# Patient Record
Sex: Male | Born: 1945 | Race: White | Hispanic: No | Marital: Married | Smoking: Former smoker
Health system: Southern US, Community
[De-identification: ages and names within clinical notes are randomized; demographics above are authoritative.]

## PROBLEM LIST (undated history)

## (undated) DIAGNOSIS — I219 Acute myocardial infarction, unspecified: Secondary | ICD-10-CM

## (undated) DIAGNOSIS — R062 Wheezing: Secondary | ICD-10-CM

## (undated) DIAGNOSIS — R634 Abnormal weight loss: Secondary | ICD-10-CM

## (undated) DIAGNOSIS — K219 Gastro-esophageal reflux disease without esophagitis: Secondary | ICD-10-CM

## (undated) DIAGNOSIS — H3500 Unspecified background retinopathy: Secondary | ICD-10-CM

## (undated) DIAGNOSIS — R059 Cough, unspecified: Secondary | ICD-10-CM

## (undated) DIAGNOSIS — I4891 Unspecified atrial fibrillation: Secondary | ICD-10-CM

## (undated) DIAGNOSIS — I2510 Atherosclerotic heart disease of native coronary artery without angina pectoris: Secondary | ICD-10-CM

## (undated) DIAGNOSIS — E785 Hyperlipidemia, unspecified: Secondary | ICD-10-CM

## (undated) DIAGNOSIS — M1990 Unspecified osteoarthritis, unspecified site: Secondary | ICD-10-CM

## (undated) DIAGNOSIS — R499 Unspecified voice and resonance disorder: Secondary | ICD-10-CM

## (undated) DIAGNOSIS — R233 Spontaneous ecchymoses: Secondary | ICD-10-CM

## (undated) DIAGNOSIS — R519 Headache, unspecified: Secondary | ICD-10-CM

## (undated) DIAGNOSIS — I10 Essential (primary) hypertension: Secondary | ICD-10-CM

## (undated) DIAGNOSIS — IMO0001 Reserved for inherently not codable concepts without codable children: Secondary | ICD-10-CM

## (undated) DIAGNOSIS — R05 Cough: Secondary | ICD-10-CM

## (undated) DIAGNOSIS — M7989 Other specified soft tissue disorders: Secondary | ICD-10-CM

## (undated) DIAGNOSIS — D369 Benign neoplasm, unspecified site: Secondary | ICD-10-CM

## (undated) DIAGNOSIS — I739 Peripheral vascular disease, unspecified: Secondary | ICD-10-CM

## (undated) DIAGNOSIS — R531 Weakness: Secondary | ICD-10-CM

## (undated) DIAGNOSIS — B351 Tinea unguium: Secondary | ICD-10-CM

## (undated) DIAGNOSIS — R197 Diarrhea, unspecified: Secondary | ICD-10-CM

## (undated) DIAGNOSIS — E0590 Thyrotoxicosis, unspecified without thyrotoxic crisis or storm: Secondary | ICD-10-CM

## (undated) DIAGNOSIS — I509 Heart failure, unspecified: Secondary | ICD-10-CM

## (undated) DIAGNOSIS — R21 Rash and other nonspecific skin eruption: Secondary | ICD-10-CM

## (undated) DIAGNOSIS — I724 Aneurysm of artery of lower extremity: Secondary | ICD-10-CM

## (undated) DIAGNOSIS — M79606 Pain in leg, unspecified: Secondary | ICD-10-CM

## (undated) DIAGNOSIS — R1310 Dysphagia, unspecified: Secondary | ICD-10-CM

## (undated) DIAGNOSIS — I255 Ischemic cardiomyopathy: Secondary | ICD-10-CM

## (undated) DIAGNOSIS — R51 Headache: Secondary | ICD-10-CM

## (undated) DIAGNOSIS — R238 Other skin changes: Secondary | ICD-10-CM

## (undated) DIAGNOSIS — Z951 Presence of aortocoronary bypass graft: Secondary | ICD-10-CM

## (undated) HISTORY — DX: Cough, unspecified: R05.9

## (undated) HISTORY — DX: Pain in leg, unspecified: M79.606

## (undated) HISTORY — DX: Unspecified atrial fibrillation: I48.91

## (undated) HISTORY — DX: Spontaneous ecchymoses: R23.3

## (undated) HISTORY — DX: Thyrotoxicosis, unspecified without thyrotoxic crisis or storm: E05.90

## (undated) HISTORY — DX: Peripheral vascular disease, unspecified: I73.9

## (undated) HISTORY — DX: Atherosclerotic heart disease of native coronary artery without angina pectoris: I25.10

## (undated) HISTORY — DX: Wheezing: R06.2

## (undated) HISTORY — DX: Reserved for inherently not codable concepts without codable children: IMO0001

## (undated) HISTORY — DX: Ischemic cardiomyopathy: I25.5

## (undated) HISTORY — DX: Diarrhea, unspecified: R19.7

## (undated) HISTORY — DX: Presence of aortocoronary bypass graft: Z95.1

## (undated) HISTORY — PX: CATARACT EXTRACTION, BILATERAL: SHX1313

## (undated) HISTORY — DX: Abnormal weight loss: R63.4

## (undated) HISTORY — DX: Aneurysm of artery of lower extremity: I72.4

## (undated) HISTORY — DX: Hyperlipidemia, unspecified: E78.5

## (undated) HISTORY — DX: Unspecified background retinopathy: H35.00

## (undated) HISTORY — DX: Other skin changes: R23.8

## (undated) HISTORY — DX: Headache: R51

## (undated) HISTORY — DX: Unspecified voice and resonance disorder: R49.9

## (undated) HISTORY — PX: CORONARY ARTERY BYPASS GRAFT: SHX141

## (undated) HISTORY — DX: Weakness: R53.1

## (undated) HISTORY — DX: Unspecified osteoarthritis, unspecified site: M19.90

## (undated) HISTORY — DX: Benign neoplasm, unspecified site: D36.9

## (undated) HISTORY — DX: Acute myocardial infarction, unspecified: I21.9

## (undated) HISTORY — DX: Essential (primary) hypertension: I10

## (undated) HISTORY — DX: Headache, unspecified: R51.9

## (undated) HISTORY — DX: Dysphagia, unspecified: R13.10

## (undated) HISTORY — DX: Rash and other nonspecific skin eruption: R21

## (undated) HISTORY — DX: Tinea unguium: B35.1

## (undated) HISTORY — DX: Other specified soft tissue disorders: M79.89

## (undated) HISTORY — DX: Heart failure, unspecified: I50.9

## (undated) HISTORY — DX: Gastro-esophageal reflux disease without esophagitis: K21.9

## (undated) HISTORY — DX: Cough: R05

---

## 1991-09-03 DIAGNOSIS — Z951 Presence of aortocoronary bypass graft: Secondary | ICD-10-CM

## 1991-09-03 HISTORY — DX: Presence of aortocoronary bypass graft: Z95.1

## 1992-09-02 HISTORY — PX: BACK SURGERY: SHX140

## 2000-02-18 ENCOUNTER — Ambulatory Visit (HOSPITAL_COMMUNITY): Admission: RE | Admit: 2000-02-18 | Discharge: 2000-02-18 | Payer: Self-pay | Admitting: Interventional Cardiology

## 2000-03-17 ENCOUNTER — Encounter: Payer: Self-pay | Admitting: Surgery

## 2000-03-19 ENCOUNTER — Inpatient Hospital Stay (HOSPITAL_COMMUNITY): Admission: RE | Admit: 2000-03-19 | Discharge: 2000-03-25 | Payer: Self-pay | Admitting: Surgery

## 2000-03-19 ENCOUNTER — Encounter: Payer: Self-pay | Admitting: Surgery

## 2000-03-20 ENCOUNTER — Encounter: Payer: Self-pay | Admitting: Surgery

## 2005-04-11 ENCOUNTER — Inpatient Hospital Stay (HOSPITAL_BASED_OUTPATIENT_CLINIC_OR_DEPARTMENT_OTHER): Admission: RE | Admit: 2005-04-11 | Discharge: 2005-04-11 | Payer: Self-pay | Admitting: Interventional Cardiology

## 2005-04-23 ENCOUNTER — Ambulatory Visit (HOSPITAL_COMMUNITY): Admission: RE | Admit: 2005-04-23 | Discharge: 2005-04-24 | Payer: Self-pay | Admitting: Interventional Cardiology

## 2005-12-24 ENCOUNTER — Emergency Department (HOSPITAL_COMMUNITY): Admission: EM | Admit: 2005-12-24 | Discharge: 2005-12-24 | Payer: Self-pay | Admitting: Emergency Medicine

## 2006-05-07 ENCOUNTER — Encounter: Admission: RE | Admit: 2006-05-07 | Discharge: 2006-05-07 | Payer: Self-pay | Admitting: Interventional Cardiology

## 2006-06-09 ENCOUNTER — Ambulatory Visit (HOSPITAL_COMMUNITY): Admission: RE | Admit: 2006-06-09 | Discharge: 2006-06-09 | Payer: Self-pay | Admitting: Interventional Cardiology

## 2006-09-02 HISTORY — PX: PR VEIN BYPASS GRAFT,AORTO-FEM-POP: 35551

## 2006-09-09 ENCOUNTER — Inpatient Hospital Stay (HOSPITAL_COMMUNITY): Admission: RE | Admit: 2006-09-09 | Discharge: 2006-09-12 | Payer: Self-pay | Admitting: *Deleted

## 2006-09-10 ENCOUNTER — Encounter: Payer: Self-pay | Admitting: Vascular Surgery

## 2006-11-10 ENCOUNTER — Ambulatory Visit (HOSPITAL_COMMUNITY): Admission: RE | Admit: 2006-11-10 | Discharge: 2006-11-10 | Payer: Self-pay | Admitting: Interventional Cardiology

## 2007-04-01 ENCOUNTER — Ambulatory Visit: Payer: Self-pay | Admitting: Vascular Surgery

## 2007-07-02 ENCOUNTER — Ambulatory Visit: Payer: Self-pay | Admitting: *Deleted

## 2007-10-05 ENCOUNTER — Ambulatory Visit: Payer: Self-pay | Admitting: *Deleted

## 2008-03-24 ENCOUNTER — Ambulatory Visit: Payer: Self-pay | Admitting: *Deleted

## 2008-12-08 ENCOUNTER — Observation Stay (HOSPITAL_COMMUNITY): Admission: AD | Admit: 2008-12-08 | Discharge: 2008-12-09 | Payer: Self-pay | Admitting: Interventional Cardiology

## 2008-12-08 ENCOUNTER — Inpatient Hospital Stay (HOSPITAL_BASED_OUTPATIENT_CLINIC_OR_DEPARTMENT_OTHER): Admission: RE | Admit: 2008-12-08 | Discharge: 2008-12-08 | Payer: Self-pay | Admitting: Interventional Cardiology

## 2009-03-17 ENCOUNTER — Ambulatory Visit: Payer: Self-pay | Admitting: *Deleted

## 2009-03-17 DIAGNOSIS — M79606 Pain in leg, unspecified: Secondary | ICD-10-CM

## 2009-03-17 HISTORY — DX: Pain in leg, unspecified: M79.606

## 2010-12-12 LAB — CBC
HCT: 40.9 % (ref 39.0–52.0)
Platelets: 226 10*3/uL (ref 150–400)
WBC: 5.1 10*3/uL (ref 4.0–10.5)

## 2010-12-12 LAB — PROTIME-INR
INR: 1.1 (ref 0.00–1.49)
INR: 1.2 (ref 0.00–1.49)
Prothrombin Time: 14.2 seconds (ref 11.6–15.2)

## 2010-12-12 LAB — BASIC METABOLIC PANEL
BUN: 10 mg/dL (ref 6–23)
Chloride: 101 mEq/L (ref 96–112)
Glucose, Bld: 125 mg/dL — ABNORMAL HIGH (ref 70–99)
Potassium: 3.8 mEq/L (ref 3.5–5.1)
Sodium: 137 mEq/L (ref 135–145)

## 2010-12-12 LAB — POCT I-STAT GLUCOSE: Operator id: 141321

## 2010-12-12 LAB — BRAIN NATRIURETIC PEPTIDE: Pro B Natriuretic peptide (BNP): 302 pg/mL — ABNORMAL HIGH (ref 0.0–100.0)

## 2010-12-12 LAB — GLUCOSE, CAPILLARY
Glucose-Capillary: 123 mg/dL — ABNORMAL HIGH (ref 70–99)
Glucose-Capillary: 133 mg/dL — ABNORMAL HIGH (ref 70–99)
Glucose-Capillary: 157 mg/dL — ABNORMAL HIGH (ref 70–99)

## 2010-12-12 LAB — CARDIAC PANEL(CRET KIN+CKTOT+MB+TROPI)
CK, MB: 1 ng/mL (ref 0.3–4.0)
Total CK: 74 U/L (ref 7–232)
Troponin I: <0.01 ng/mL (ref 0.00–0.06)

## 2011-01-15 NOTE — Procedures (Signed)
BYPASS GRAFT EVALUATION   INDICATION:  Follow-up left fem-pop bypass graft.   HISTORY:  Diabetes:  Yes.  Cardiac:  CABG in 1993 and 2001.  Hypertension:  Yes.  Smoking:  No.  Previous Surgery:  Please see above.   SINGLE LEVEL ARTERIAL EXAM                               RIGHT              LEFT  Brachial:                    139                145  Anterior tibial:             144                141  Posterior tibial:            126                147  Peroneal:  Ankle/brachial index:        0.98               1.00   PREVIOUS ABI:  Date: 10/05/2007  RIGHT:  >1  LEFT:  >1   LOWER EXTREMITY BYPASS GRAFT DUPLEX EXAM:   DUPLEX:  Patent left fem-pop bypass graft, no evidence of focal  stenosis.   IMPRESSION:  1. Patent left fem-pop bypass graft with no evidence of focal      stenosis.  2. Normal ankle branchial index with biphasic Doppler waveform noted      in bilateral legs.  3. Status post left fem-pop bypass.   ___________________________________________  P. Liliane Bade, M.D.   MG/MEDQ  D:  03/24/2008  T:  03/24/2008  Job:  [id number]

## 2011-01-15 NOTE — Procedures (Signed)
BYPASS GRAFT EVALUATION   INDICATION:  Follow up left leg bypass graft.   HISTORY:  Diabetes:  Yes.  Cardiac:  Coronary artery disease, CABG in May 1993 followed by a re-do  CABG in July 2001.  The patient is also status post an MI in 23.  Hypertension:  Yes.  Smoking:  Quit in 1985.  Previous Surgery:  Left superficial femoral to popliteal artery bypass  graft with reverse saphenous vein on September 09, 2006, by Dr. Madilyn Fireman.   SINGLE LEVEL ARTERIAL EXAM                               RIGHT              LEFT  Brachial:                    140                140  Anterior tibial:             110                110  Posterior tibial:            124                136  Peroneal:  Ankle/brachial index:        0.89               0.97   PREVIOUS ABI:  Date: 09/10/2006  RIGHT:  0.77  LEFT:  0.88   LOWER EXTREMITY BYPASS GRAFT DUPLEX EXAM:   DUPLEX:  Doppler arterial waveforms are biphasic proximal to, throughout  and distal to the graft.  Velocities are within normal limits  throughout.   IMPRESSION:  Patent left superficial femoral to popliteal artery bypass  graft.  ABIs are improved bilaterally from previous study.   ___________________________________________  P. Liliane Bade, M.D.   DP/MEDQ  D:  04/01/2007  T:  04/02/2007  Job:  [id number]

## 2011-01-15 NOTE — Procedures (Signed)
BYPASS GRAFT EVALUATION   INDICATION:  Followup, left leg bypass graft.   HISTORY:  Diabetes:  Yes.  Cardiac:  Coronary artery disease, CABG in 1993 and redo CABG in 2001,  MI in 1995.  Hypertension:  Yes.  Smoking:  No.  Previous Surgery:  Left superficial femoral-popliteal artery bypass  graft with reversed saphenous vein in 01/08 by Dr. Madilyn Fireman.   SINGLE LEVEL ARTERIAL EXAM                               RIGHT              LEFT  Brachial:                    124                120  Anterior tibial:             124                110  Posterior tibial:            126                136  Peroneal:  Ankle/brachial index:        >1.0               >1.0   PREVIOUS ABI:  Date: 07/02/07  RIGHT:  0.94  LEFT:  1.0   LOWER EXTREMITY BYPASS GRAFT DUPLEX EXAM:   DUPLEX:  Doppler arterial waveforms are biphasic proximal to,  throughout, and distal to the graft.  Velocities are within normal  limits.   IMPRESSION:  1. Patent left femoral-popliteal artery bypass graft.  2. Ankle brachial indices are within normal limits bilaterally.    ___________________________________________  P. Liliane Bade, M.D.   DP/MEDQ  D:  10/05/2007  T:  10/05/2007  Job:  [id number]

## 2011-01-15 NOTE — Discharge Summary (Signed)
[name], [name] NO.:  [id number]   MEDICAL RECORD NO.:  [id number]          PATIENT TYPE:  INP   LOCATION:  2035                         FACILITY:  [facility]   PHYSICIAN:  Lyn Records, M.D.   DATE OF BIRTH:  June 15, 1946   DATE OF ADMISSION:  12/08/2008  DATE OF DISCHARGE:  12/09/2008                               DISCHARGE SUMMARY   [name] is a 65 year old male patient of Dr. Katrinka Blazing who has known  coronary artery disease.  He had coronary artery bypass grafting in  1993, with the following grafts:  LIMA to the LAD, sequential saphenous  vein graft to the OM1 and OM2, saphenous vein graft to the diagonal,  saphenous vein graft to the PDA.  He had redo CABG in 2002, with a free  RIMA to the LAD and saphenous vein graft to the PDA.  He has had a drug-  eluting stent to the saphenous vein graft of the distal circumflex in  2006.  He did have an ischemic cardiomyopathy, but by 2006, his LVEF had  improved to 55%.   Apparently, he had been having some chest pain and this was similar to  his prior anginal episodes.  He was brought into the hospital for  diagnostic cardiac catheterization on December 08, 2008.  Incidentally, upon  arrival, he was found to be in atrial fibrillation with RVR.   We went ahead and did the cardiac catheterization and this showed that  the seventh vein graft to the RCA/PDA was occluded, but all other grafts  were patent.  His EF was 40%.  He tolerated the procedure well and then  we started treating him for his atrial fibrillation.   He was started on amiodarone and by discharge he was back in normal  sinus rhythm.  We also started Coumadin and will be loading  him as an  outpatient.   LABORATORY STUDIES DURING HOSPITALIZATION:  At the time of discharge,  his protime was 15.5 with an INR of 1.2.  Cardiac enzymes were negative.  Sodium 137, potassium 3.8, BUN 10, creatinine 0.83, and BNP 302.  Hemoglobin 14.0, hematocrit 40.9, platelets  226, and white count 5.1.  Chest x-ray showed cardiomegaly.  No evidence for pulmonary edema.   DISCHARGE MEDICATIONS:  1. Amiodarone 200 mg 2 tablets daily.  2. Coumadin 5 mg a day.  3. He is to restart his metformin on Sunday, December 11, 2008.  4. New dose of Coreg 25 mg one-half tablet twice a day.  5. Potassium 20 mEq a day.  6. Lipitor 40 mg a day.  7. Lasix 80 mg a day.  8. Isosorbide dinitrate 40 mg daily.  9. Plavix 75 mg a day.  10.Micardis 80 mg a day.  11.Nifedipine 90 mg a day.  12.Glucotrol 10 mg daily.  13.He is to stop taking aspirin.   DISCHARGE INSTRUCTIONS:  Remained on low-sodium heart-healthy diet.  Clean cath site gently with soap and water.  Increase activity slowly.  No lifting over 10 pounds for 1 week.  No driving for 2 days.   FOLLOW UP:  1.  Follow up in the [facility] on December 13, 2008, at 3:30 p.m.  2. Follow up with Dr. Katrinka Blazing with an EKG on December 29, 2008, at 3:30      p.m.   DISPOSITION:  He is discharged to home in stable but improved condition.      Guy Franco, P.A.      Lyn Records, M.D.  Electronically Signed    LB/MEDQ  D:  12/09/2008  T:  12/10/2008  Job:  [id number]   cc:   Theressa Millard, M.D.

## 2011-01-15 NOTE — Procedures (Signed)
BYPASS GRAFT EVALUATION   INDICATION:  Follow-up evaluation of lower extremity bypass graft.   HISTORY:  Diabetes:  Yes.  Cardiac:  CABG in 1993 and in 2001.  Hypertension:  Yes.  Smoking:  No.  Previous Surgery:  Left superficial femoral to popliteal artery bypass  graft with reverse saphenous vein on 01/08 by Dr. Madilyn Fireman.   SINGLE LEVEL ARTERIAL EXAM                               RIGHT              LEFT  Brachial:                    153                157  Anterior tibial:             133                156  Posterior tibial:            125                162  Peroneal:  Ankle/brachial index:        0.85               1.03   PREVIOUS ABI:  Date: 03/24/08  RIGHT:  0.98  LEFT:  1.00   LOWER EXTREMITY BYPASS GRAFT DUPLEX EXAM:   DUPLEX:  Biphasic duplex waveforms noted within the left femoral-to-  popliteal artery bypass graft and native artery.   IMPRESSION:  1. Patent left femoropopliteal bypass graft with no evidence of focal      stenosis.  2. Right lower extremity ankle brachial indices suggest mild arterial      disease with biphasic Doppler waveforms.  3. Normal left lower extremity ankle brachial index with biphasic      Doppler waveforms.        ___________________________________________  P. Liliane Bade, M.D.   AC/MEDQ  D:  03/20/2009  T:  03/20/2009  Job:  [id number]

## 2011-01-15 NOTE — Cardiovascular Report (Signed)
[name], [name]             ACCOUNT NO.:  [id number]   MEDICAL RECORD NO.:  [id number]          PATIENT TYPE:  OIB   LOCATION:  1962                         FACILITY:  [facility]   PHYSICIAN:  Lyn Records, M.D.   DATE OF BIRTH:  1945/10/01   DATE OF PROCEDURE:  DATE OF DISCHARGE:  12/08/2008                            CARDIAC CATHETERIZATION   INDICATIONS:  Mr. Knauer has progressive episodes of chest tightness  and dyspnea.  He has a history two prior coronary artery bypass  operations, most recently in 2002.  He has a free RIMA graft to the LAD  and saphenous vein graft to the PDA with a history of sequential  saphenous vein graft to the distal circumflex with DES stenting LIMA to  the LAD and a known saphenous vein graft to the diagonal.   PROCEDURE PERFORMED:  1. Left heart catheterization.  2. Left ventriculography.  3. Coronary angiography.  4. Saphenous vein graft angiography.  5. Internal mammary artery graft angiography.   DESCRIPTION:  After informed consent, 1% Xylocaine was used to achieve  anesthesia.  A 4-French sheath was placed in the right femoral artery  using a modified Seldinger technique.  Some difficulty with palpating  the femoral artery was noted.  A 4-French A2 multipurpose catheter was  then used for hemodynamic recordings, left ventriculography by hand  injection, and saphenous vein graft angiography.  The internal mammary  artery catheter was used for left internal mammary graft angiography.  The native right coronary was cannulated with the multipurpose catheter.  A #4 4-French left Judkins catheter was used for left coronary  angiography.  No complications occurred.   Of note during this procedure, the patient's heart rate remained above  100 and varied between 100 and 125, and analysis of the rhythm strips  demonstrated atrial fibrillation.  This is a new rhythm disturbance.  The most recent EKG on November 16, 2008, demonstrated normal sinus  rhythm  with PAC.   RESULTS:  1. Hemodynamic data:      a.     Aortic pressure 120/70.      b.     Left ventricular pressure 126/11.  2. Left ventriculography:  Left ventricle is dilated.  There is      anterior wall and inferior wall hypokinesis.  EF 40%, plus or minus      10 percentage points.  LV is poorly opacified by hand injection.  3. Coronary angiography.      a.     Left main coronary artery:  Highly diseased with greater       than 90% stenosis.      b.     Left anterior descending coronary artery:  Occluded.      c.     Circumflex artery:  Totally occluded.      d.     Right coronary artery:  Totally occluded.  4. Bypass graft angiography.      a.     Saphenous vein graft to the diagonal:  Patent with moderate       diffuse disease in the diagonal distal to the  graft insertion       site.      b.     Saphenous sequential graft to the obtuse marginal/circumflex       territory:  This graft is widely patent.  The Cypher stent at the       distal graft before the origin of the PDA is widely patent.  The       obtuse marginal branches that arise distal to the anastomoses are       widely patent.      c.     Saphenous vein graft to the right coronary artery:  Totally       occluded.      d.     RIMA to the distal LAD:  Widely patent.      e.     Second saphenous vein graft to the PDA:  Totally occluded.  5. LIMA to the LAD:  Widely patent.   CONCLUSIONS:  1. There is severe native vessel coronary disease with total occlusion      of the LAD, circumflex, and right coronary.  2. Bypass graft occlusive disease with total occlusion of both      saphenous vein grafts to the right coronary.  The distal RCA is      supplied by collaterals from the LAD territory via __________.  3. Patent saphenous vein graft sequential to the obtuse marginal      system, and saphenous vein graft to the diagonal.  4. Patent free RIMA to the distal LAD.  5. Patent LIMA to the LAD.  6. Atrial  fibrillation with rapid ventricular response.   PLAN:  Continue medical therapy for coronary artery disease.  Pharmacologic control of the patient's atrial fibrillation to include  the possibility of rhythm control versus rate control.  We will start  amiodarone, titrate dose upwards.  We will start Coumadin.  We will plan  on electrocardioversion if he does not convert spontaneously.      Lyn Records, M.D.  Electronically Signed     HWS/MEDQ  D:  12/08/2008  T:  12/09/2008  Job:  [id number]

## 2011-01-15 NOTE — Procedures (Signed)
BYPASS GRAFT EVALUATION   INDICATION:  Follow up of left SFA-popliteal bypass graft.  Patient  denies claudication or rest pain.  Left leg with swelling.   HISTORY:  Diabetes:  Yes.  Cardiac:  Coronary artery disease.  CABG in 01/1992 followed by a redo  CABG in 03/2000.  MI in 1995.  Hypertension:  Yes.  Smoking:  No.  Previous Surgery:  Left SFA-popliteal bypass graft with reverse  saphenous vein on 09/10/1999 by Dr. Madilyn Fireman.   SINGLE LEVEL ARTERIAL EXAM                               RIGHT              LEFT  Brachial:                    130                130  Anterior tibial:             118                130  Posterior tibial:            122                128  Peroneal:  Ankle/brachial index:        0.94               1.0   PREVIOUS ABI:  Date: 04/01/07  RIGHT:  0.89  LEFT:  0.97   LOWER EXTREMITY BYPASS GRAFT DUPLEX EXAM:   DUPLEX:  Doppler arterial waveforms are biphasic proximal to,  throughout, and distal to the graft.  Velocities are within normal  limits throughout.   IMPRESSION:  1. Patent left superficial femoral artery/popliteal artery bypass      graft.  2. Ankle brachial indices are stable (slightly higher) from previous      study.   ___________________________________________  P. Liliane Bade, M.D.   PB/MEDQ  D:  07/02/2007  T:  07/03/2007  Job:  [id number]

## 2011-01-18 NOTE — Discharge Summary (Signed)
NAMEROLLY, MAGRI             ACCOUNT NO.:  [id number]   MEDICAL RECORD NO.:  [id number]          PATIENT TYPE:  INP   LOCATION:  2039                         FACILITY:  [facility]   PHYSICIAN:  Balinda Quails, M.D.    DATE OF BIRTH:  01-Oct-1945   DATE OF ADMISSION:  09/09/2006  DATE OF DISCHARGE:  09/12/2006                               DISCHARGE SUMMARY   CHIEF COMPLAINT:  Left popliteal aneurysm.   HISTORY OF PRESENT ILLNESS:  The patient is a 65 year old white male  with a history of lower extremity claudication symptoms that had  worsened over the past few months.  He complains of bilateral calf  discomfort, relieved with rest.  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  Arteriogram by  Dr. Eldridge Dace showed significant bilateral SFA disease with a left  popliteal aneurysm, moderate right common iliac artery disease and mild  left common iliac artery disease.  He was referred to Dr. Madilyn Fireman for a  vascular opinion, who recommended left fem/pop bypass with repair of the  left popliteal aneurysm.  Patient was admitted, this hospitalization,  for the procedure.   PAST MEDICAL HISTORY:  1. Coronary artery disease.  2. Hypertension.  3. Type 2 diabetes mellitus.  4. Hyperlipidemia.  5. Congestive heart failure.   PAST SURGICAL HISTORY:  Included:  1. Coronary artery bypass grafting in 1993 by Dr. Andrey Campanile.  2. Redo coronary artery bypass grafting in 2001 by Dr. Laverle Hobby.  3. Back surgery, L5 discectomy in 1994.  4. PTA and stenting coronary arteries in 2006.   ALLERGIES:  PENICILLIN CAUSES A RASH.   MEDICATIONS PRIOR TO ADMISSION:  Included:  1. Aspirin 325 mg daily.  2. Metformin ER 500 mg b.i.d.  3. Lipitor 40 mg daily.  4. Coreg 25 mg b.i.d.  5. Plavix 75 mg daily.  6. Lasix 40 mg daily.  7. Potassium chloride 20 mEq daily.  8. Isosorbide mononitrate ER 120  mg daily.  9. Nifedipine ER XL 90 mg daily.  10.Glipizide 10 mg daily.  11.Diovan 320 mg daily.   FAMILY HISTORY:  Please see the history and physical done at the time of  admission.   SOCIAL HISTORY:  Please see the history and physical done at the time of  admission.   REVIEW OF SYSTEMS:  Please see the history and physical done at the time  of admission.   PHYSICAL EXAMINATION:  Please see the history and physical done at the  time of admission.   HOSPITAL COURSE:  The patient was admitted electively and on September 09, 2006 he was taken to the operating room, at which time, he underwent a  ligation of left popliteal artery aneurysm with left superficial femoral  artery to popliteal artery bypass grafting with reversed saphenous vein  graft from the left leg.  Patient tolerated this procedure well and was  taken to the postanesthesia care unit in stable condition.   POSTOPERATIVE HOSPITAL COURSE:  Patient had done quite well.  He  has  remained hemodynamically stable.  He has tolerated a routine advancement  in activity, commenced to the level of postoperative convalescence using  standard protocols.  He has been somewhat slow in this, but has shown  steady improvement.  His incisions are all healing well without signs of  infection, although he does have moderate serous sanguinous drainage,  but no evidence of infection.  His ankle-brachial index, done  postoperatively, revealed a 0.77 on the right and a 0.88 on the left.  The patient's diabetes has been under adequate control.  He has required  some potassium replacement for hypokalemia.  His hemoglobin and  hematocrit are stable postoperatively with hemoglobin and hematocrit  dated September 10, 2006 at 11.0 and 33.0 respectively.  BUN and creatinine  are stable and within normal limits with most recent measurements dated  September 11, 2006, 19 and 1.1.  Overall, the patient's status is felt to  be stable for tentative discharge  in the morning of September 12, 2006,  pending morning round reevaluation.   CONDITION ON DISCHARGE:  Stable, improving.   MEDICATION ON DISCHARGE:  As preoperatively.  Additionally, for pain,  Tylox 1 or 2 every 4-6 hours p.r.n. as needed.   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   OTHER DIAGNOSES:  As previously listed per the history.      Rowe Clack, P.A.-C.      Balinda Quails, M.D.  Electronically Signed    WEG/MEDQ  D:  09/11/2006  T:  09/12/2006  Job:  [id number]   cc:   Theressa Millard, M.D.  Corky Crafts, MD

## 2011-01-18 NOTE — H&P (Signed)
[name], [name]             ACCOUNT NO.:  [id number]   MEDICAL RECORD NO.:  [id number]          PATIENT TYPE:  INP   LOCATION:  NA                           FACILITY:  [facility]   PHYSICIAN:  [name], M.D.    DATE OF BIRTH:  09/11/1945   DATE OF ADMISSION:  09/09/2006  DATE OF DISCHARGE:                              HISTORY & PHYSICAL   REASON FOR ADMISSION:  Left popliteal aneurysm.   HISTORY OF PRESENT ILLNESS:  The patient is a 64 year old white male  with a history of bilateral lower extremity claudication symptoms which  have been going on for some time now but have worsened progressively  over the past few months.  His main complaint is bilateral calf  discomfort which is exacerbated with activity and relieved with rest.  He states at this point he can only walk about 100 yards without  developing significant pain.  He denies any rest pain or night pain but  has also had some bilateral lower extremity edema.  No nonhealing  ulcerations.  He was seen by Dr. 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  He subsequently underwent an  arteriogram by Dr. Eldridge [name] which showed significant bilateral  superficial femoral artery disease with a left popliteal aneurysm,  moderate right common iliac artery disease, and mild left common iliac  artery disease.  Because of these findings and his worsening symptoms,  he was subsequently referred to Dr. Liliane [name] for surgical  consideration.  Dr. Madilyn [name] recommended proceeding with a left femoral-to-  popliteal bypass graft and left popliteal artery aneurysm repair at this  time.   PAST MEDICAL HISTORY:  1. Coronary artery disease.  2. Hypertension.  3. Type 2, non-insulin-dependent diabetes mellitus.  4. Hyperlipidemia.  5. Congestive heart failure.   PAST SURGICAL HISTORY:  1. Coronary artery bypass grafting in 1993 by Dr. Andrey [name].  2. Redo CABG in 2001 by Dr. Laneta [name].  3. L5 diskectomy and 1994.  4. PTCA and stent placement with a drug-eluding stent in 2006 by Dr.      Katrinka [name].   CURRENT MEDICATIONS:  1. Aspirin 325 mg daily.  2. Metformin ER 500 mg b.i.d.  3. Lipitor 40 mg daily.  4. Coreg 25 mg b.i.d.  5. Plavix 75 mg daily, last taken 1 week ago.  6. Lasix 40 mg daily.  7. Potassium 20 mEq daily.  8. Isosorbide mononitrate ER 120 mg daily.  9. Nifedipine ER XL 90 mg daily.  10.Glipizide 10 mg daily.  11.Diovan 320 mg daily.   SOCIAL HISTORY:  He is married and has two children.  He is retired from  the [employer] and [employer].  He previously  smoked two packs of cigarettes per day x20 years and quit in 1980.  He  does not consume alcohol.   FAMILY HISTORY:  Both his parents are deceased and had coronary artery  disease, hypertension and diabetes.  He has  two brothers who are alive  and well with no chronic medical problems.   REVIEW OF SYSTEMS:  See History of Present Illness for pertinent  positives and negatives.  Also, he has problems with shortness of breath  both at rest and dyspnea on exertion as well as frequent angina-type  symptoms for which he takes nitroglycerin.  He states that he has  extensive coronary artery disease, which at this point can only be  treated medically, and a is followed very closely by Dr. Katrinka [name].  He had  a recent upper respiratory infection which is now resolved.  He has had  a post infectious cough which has persisted but is improving.  He also  has frequent indigestion symptoms as well as bilateral lower extremity  edema.  He denies fevers, chills, weight loss, TIA symptoms, visual  changes, amaurosis fugax, dysphagia, dysarthria, weakness, heart  palpitations, orthopnea, PND, wheezing, hemoptysis, abdominal pain,  nausea, vomiting, diarrhea, constipation,  reflux symptoms, hematochezia,  melena, hematuria, dysuria or nocturia, muscle or joint pain or  weakness, heat or cold intolerance, anxiety, depression or other  psychiatric illness.   PHYSICAL EXAMINATION:  VITAL SIGNS: Blood pressure is 138/74, heart rate  72 and regular, respirations 18 and unlabored.  GENERAL:  This is an obese white male in no acute distress.  HEENT: Normocephalic, atraumatic.  Pupils equal, round and react to  light accommodation.  Extraocular movements intact. TMs and canals are  clear.  Oropharynx is clear with upper dentures in place.  NECK:  Supple without lymphadenopathy, thyromegaly or carotid bruits.  HEART:  Regular rate and rhythm without murmurs, rubs or gallops.  He  has a well-healed median sternotomy scar.  LUNGS:  Clear to auscultation.  Soft, obese, nontender, nondistended with active bowel sounds in all  quadrants.  No masses or hepatosplenomegaly.  EXTREMITIES: He has a well-healed left radial artery harvest scar.  He  also has well-healed saphenectomy scars on the left from the ankle to  the knee and on the right from the ankle to the groin.  His lower  extremities show some brawny edema bilaterally.  He has 2+ femoral and  no palpable pedal pulses bilaterally.  NEUROLOGIC: Cranial nerves II-XII grossly intact.  He is alert and  oriented x3. His gait is within normal limits.  Muscle strength is 5+  and symmetrical bilaterally.   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  He will be admitted to [facility] on  September 09, 2006, and undergo repair of left popliteal aneurysm and a  left femoral-to-popliteal bypass graft by Dr. Liliane [name].      [name], P.A.      [name], M.D.  Electronically Signed    GC/MEDQ  D:  09/08/2006  T:  09/08/2006  Job:  [id number]   cc:   [name], M.D. [name],  M.D.  Adam Crafts, MD

## 2011-01-18 NOTE — Op Note (Signed)
[facility]  Patient:    [name], [name]                    MRN: [id number] Proc. Date: 03/19/00 Adm. Date:  [id number] Attending:  Cleatrice Burke CC:         Alleen Borne, M.D., CVTS             Darci Needle III, M.D.             [facility]                           Operative Report  PREOPERATIVE DIAGNOSES:  Severe native three-vessel coronary artery disease with occluded saphenous vein graft to the right coronary artery, status post coronary artery bypass graft surgery in 1993.  POSTOPERATIVE DIAGNOSES:  Severe native three-vessel coronary artery disease with occluded saphenous vein graft to the right coronary artery, status post coronary artery bypass graft surgery in 1993.  OPERATIVE PROCEDURE:  Redo median sternotomy, extracorporeal circulation, coronary artery bypass graft surgery x 2 using a left radial artery graft to the distal left anterior descending coronary artery, with a saphenous vein graft to the posterior descending branch of the right coronary artery.  ATTENDING SURGEON:  Alleen Borne, M.D.  ASSISTANTS: Lissa Merlin, P.A. ______  ANESTHESIA:  General endotracheal.  CLINICAL HISTORY:  This patient is a 65 year old obese white male with a history of diabetes, who is status post coronary artery bypass x 5 by Dr. Delsa Grana. Wilson in May of 1993.  At that time, he had a left mammary to the LAD with a saphenous vein graft to the diagonal, a saphenous vein graft to the posterior descending and a sequential saphenous vein graft to the first and second obtuse marginal branches.  His last catheterization in 1994 showed severe native three-vessel disease, with widely patent grafts and preserved left ventricular function.  Over the past year, he has had progressive substernal chest pressure and shortness of breath, which he is now having on a daily basis.  Cardiolite scan on February 04, 2000 showed a large  fixed defect in the mid and distal anterior wall extending to the apex, as well as in the inferior wall from the base of the apex.  There was a new area of ischemia involving the anterior basal region in the distribution of the proximal LAD and diagonal branches.  Left ventricular ejection fraction was 54%.  Perfusion to the lateral wall and the septum appeared normal.  He underwent cardiac catheterization on February 19, 2000.  This showed diffuse 50% left main narrowing with severe native three-vessel disease.  The LAD was totally occluded in its midportion after the second diagonal branch.  There is diffuse disease throughout the proximal LAD with up to 80% stenosis.  The left circumflex is totally occluded proximally.   The right coronary artery was totally occluded in the midportion.  The saphenous vein graft of the two marginal branches was smooth and widely patent, although there was some narrowing between the first and second branches measuring less than 50%.  The saphenous vein graft to the diagonal branch was widely patent.  The saphenous vein graft to the right coronary artery was occluded at its origin.  The left mammary to the LAD was widely patent.  There was an 80% eccentric LAD stenosis just beyond the distal graft insertion site.  The distal  LAD gave collaterals around the apex and through a diagonal and septal perforators to the occluded distal right coronary artery.  After reviewing the angiograms and examination of the patient, it was felt that a redo coronary bypass surgery was probably the best treatment for long-term prevention of ischemia and infarction.  Dr. Katrinka Blazing and I reviewed the films and discussed the patient in detail and felt that surgery would be a better option than high-risk percutaneous intervention.  I discussed the operative procedure with the patient and his wife including alternatives, benefits and risks including bleeding, possible blood transfusion,  infection, stroke, myocardial infarction and death; they understood and agreed to proceed.  DESCRIPTION OF PROCEDURE:  The patient was taken to the operating room and placed on the table in a supine position.  After induction of general endotracheal anesthesia, a Foley catheter was placed in the bladder using sterile technique.  Then the left arm was placed on an arm board and prepped with Betadine soaping solution and draped in the usual sterile manner.  The left radial artery was harvested as a free graft.  This was a large-caliber vessel.  Preoperative Allens test showed complete palmar arches.  I again tested perfusion of the hand in the operating room by occluding the radial artery with an atraumatic vascular clamp and then checking Doppler flow distal to the clamp; there was a good Doppler signal.  The proximal and distal ends of the radial artery were suture-ligated with 2-0 silk suture.  The arm incision was then closed in layers using continuous 2-0 Vicryl for the subcutaneous tissue and 3-0 Vicryl subcuticular skin closure.  The sponge, needle and instrument counts were correct according to the scrub nurse at this point.  A dry sterile dressing was applied over the arm incision and it was wrapped with a sterile Kerlix gauze and an Ace wrap.  Then, the left arm was positioned at the side on an arm board.  After repositioning the patient, the chest, abdomen and both lower extremities were prepped and draped in the usual sterile manner.  The chest was entered the previous median sternotomy incision.  The sternum was opened using the oscillating saw without difficulty.  The mediastinal structures were dissected from the back of the sternum.  At the same time, a segment of greater saphenous vein was harvested from the thigh and this vein was of large caliber and good quality.  Dissection was performed to expose the right atrium and ascending aorta.  This dissection was difficult  due to dense adhesions and a large amount of mediastinal and epicardial fat.  The ascending aorta was also relatively  short, which made exposure difficult.  The patient was then heparinized and when an adequate activated clotting time was achieved, the distal ascending aorta was cannulated using a 24-French aortic cannula for arterial inflow. Venous outflow was achieved using a two-stage venous cannula through the right atrium.  An antegrade cardioplegia and vent cannula was inserted in the aortic root.  The patient was then placed on cardiopulmonary bypass and the remainder of the heart was dissected from the pericardium.  There were dense adhesions. The left internal mammary pedicle was identified and carefully preserved. Then, a retrograde cardioplegia cannula was inserted through the right atrium into the coronary sinus.  The aorta was then cross-clamped and 300 cc of cold blood antegrade cardioplegia was administered in aortic root with slow arrest of the heart. An atraumatic vascular clamp was placed across the left mammary pedicle.  This  was followed by 300 cc of cold blood retrograde cardioplegia.  There was still very slow arrest of the heart.  A temperature probe was placed in the septum and myocardial temperature was decreased to about 15 degrees centigrade.  Then, the first distal anastomosis was performed to the distal left anterior descending coronary artery about 2 cm beyond the previous mammary artery anastomosis.  The internal diameter was about 1.6 mm.  There was no disease in this area.  The conduit used was the left radial artery and this was anastomosed in an end-to-side manner using continuous 8-0 Prolene suture.  The pedicle was tacked to the epicardium with 6-0 Prolene sutures to prevent rotation.  Flow was measured through the graft and was excellent.  Then, a dose of cardioplegia was given down this graft and into the aortic root.  The second distal  anastomosis was performed to the posterior descending branch of the right coronary artery.  The internal diameter was about 1.5 mm.  The conduit that was used was the segment of greater saphenous vein and the anastomosis performed in an end-to-side manner using continuous 7-0 Prolene suture.  This was a difficult anastomosis due to the small caliber of the artery and a large caliber of the vein.  Flow was measured through the graft and was excellent.  Then, another dose of cardioplegia was given down the vein graft and the radial artery graft and into the aortic root.  The patient was rewarmed to 37 degrees centigrade.  With a cross-clamp in place, the two proximal anastomoses were performed to the aortic root in an end-to-side manner using continuous 6-0 Prolene suture.  The clamp was then removed from the mammary pedicle.  There was rapid rewarming of the ventricular septum. The cross-clamp was then removed with a time of 108 minutes.  There was spontaneous return of sinus rhythm.  The proximal and distal anastomoses appeared hemostatic and the lines of the grafts satisfactory.  A graft marker was placed around the proximal anastomosis.  Two temporary right ventricular and right atrial pacing wires were placed and brought out through the skin.  When the patient had rewarmed to 37 degrees centigrade, he was weaned from cardiopulmonary bypass on low-dose dopamine.  Total bypass time was 157 minutes.  Cardiac function appeared excellent with a cardiac output of 6 to 7 l/min.  Protamine was given.  The patient was given a aprotinin throughout the case.  The venous and aortic cannulae were removed without difficulty. Hemostasis was achieved.  Three chest tubes were placed, with a tube in the right pleural space, one in the posterior pericardium and one in the anterior mediastinum.  There was no pericardium to close over the heart.  The sternum was closed with #6 stainless steel wires.  The  fascia was closed with a continuous #1 Vicryl suture.  Subcutaneous tissue was closed using continuous 2-0 Vicryl and the skin with 3-0 Vicryl subcuticular closure.  The lower extremity vein harvest site was closed in layers in a similar manner.  The sponge, needle and instrument counts were correct according to the scrub nurse.  A dry sterile dressing was applied over the incision and around the chest tubes, which were hooked to Pleur-evac suction.  The patient remained hemodynamically stable and was transported to the SICU in guarded but stable condition.  In summary, this was a difficult operation and the heart was lying over towards the left side, which would have made exposure of the lateral wall extremely difficult.  I would not advise a third-time redo operation on this patient. DD:  03/19/00 TD:  03/21/00 Job: [id number]

## 2011-01-18 NOTE — Op Note (Signed)
[name], [name]             ACCOUNT NO.:  [id number]   MEDICAL RECORD NO.:  [id number]          PATIENT TYPE:  INP   LOCATION:  3305                         FACILITY:  [facility]   PHYSICIAN:  Balinda Quails, M.D.    DATE OF BIRTH:  1946/03/30   DATE OF PROCEDURE:  09/09/2006  DATE OF DISCHARGE:                               OPERATIVE REPORT   SURGEON:  P Bud Face, MD   ASSISTANT:  Coral Ceo, PA and Jerold Coombe, P.A.   ANESTHETIC:  General endotracheal.   PREOPERATIVE DIAGNOSIS:  Left popliteal artery aneurysm.   POSTOPERATIVE DIAGNOSIS:  Left popliteal artery aneurysm.   PROCEDURE:  1. Ligation of left popliteal artery aneurysm.  2. Left superficial femoral popliteal bypass with reversed saphenous      vein graft.   CLINICAL NOTE:  [name] is a 65 year old male with a long  history of atherosclerotic vascular disease.  He was referred by Dr.  Eldridge Dace for management of a left popliteal artery aneurysm diagnosed by  CT angiogram followed by lower extremity arteriography revealing left  superficial femoral artery and popliteal occlusive disease.  Brought to  the operating at this time for ligation of popliteal artery aneurysm  with revascularization.   PROCEDURE NOTE:  The patient brought to the operating room stable  condition.  Placed in supine position.  General endotracheal anesthesia  induced.  Left leg prepped and draped in sterile fashion.   Oblique skin incision made in the left groin.  Dissection carried down  through subcutaneous tissue with electrocautery.  The left saphenous  vein was identified.  This was a large-caliber vein.  Dissected up to  the saphenofemoral junction and tributaries ligated with 3-0 silk and  divided.  Separate longitudinal skin incisions were then made throughout  the left thigh.  The greater saphenous vein exposed through the  incisions and tributaries ligated with 3-0 silk and divided.  The vein  exposed  down to the level of the knee.  At the knee a longitudinal  incision made along the posterior margin of the tibia.  Deep dissection  carried through the subcutaneous tissue with electrocautery.  The vein  was mobilized distally down to the proximal third of the calf and  tributaries ligated with 3-0 silk and divided.   The popliteal fossa then entered medially.  The gastrocnemius fascia  divided.  Popliteal fossa entered and the left distal popliteal artery  was freed from the vein.  The artery was moderately calcified.  There  was a palpable pulse present.  The artery was encircled with vessel  loop.   Through the distal thigh incision the sartorius fascia was incised.  The  sartorius muscle reflected anteriorly.  Just beyond the adductor canal  the popliteal artery was exposed and encircled with vessel loop.   The proximal left superficial femoral artery was then exposed through  the proximal thigh incision.  This was moderately calcified but revealed  an excellent pulse.  Was encircled with vessel loop.   A subsartorial tunnel was then created.  This was tunneled between the  gastrocnemius muscles, gastrocnemius  heads along subsartorial plane from  below the knee up to the groin.   The patient administered 7000 units heparin intravenously.  Adequate  circulation time permitted.  The left superficial femoral artery then  controlled proximally distally with clamps.  Longitudinal arteriotomy  made.  The reverse vein graft was then anastomosed end-to-side to the  proximal superficial femoral artery using running 6-0 Prolene suture.  At completion of the proximal anastomosis the artery was flushed.  Excellent flow present through the graft.  The graft was then brought  down through the subsartorial tunnel to the exposed below-knee popliteal  artery.  Popliteal artery controlled proximally and distally with  bulldog clamps.  Longitudinal arteriotomy made.  The leg straightened,  the  vein graft measured and divided and beveled.  Anastomosed end-to-  side to the below-knee popliteal artery using running 6-0 Prolene  suture.  At completion of the distal anastomosis the graft was flushed.  Clamps removed.  Excellent flow present.   The popliteal aneurysm was then trap ligated.  0 silk tie was used to  ligate popliteal artery above and below the aneurysm.   Adequate hemostasis obtained.  Sponge instrument counts correct.   The groin and thigh incisions were closed with running 2-0 Vicryl suture  in the subcutaneous layer.  Staples applied to skin.  The below-knee  popliteal incision was closed with a deep layer of interrupted 2-0  Vicryl suture, a superficial subcutaneous layer of running 2-0 Vicryl  suture.  Staples applied to skin.   The patient tolerated procedure well.  There were no apparent  complications.  Transferred to recovery room in stable condition.      Balinda Quails, M.D.  Electronically Signed     PGH/MEDQ  D:  09/09/2006  T:  09/10/2006  Job:  [id number]   cc:   Corky Crafts, MD
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
## 2011-01-18 NOTE — Op Note (Signed)
NAMECYPRESS, FANFAN             ACCOUNT NO.:  [id number]   MEDICAL RECORD NO.:  [id number]          PATIENT TYPE:  AMB   LOCATION:  SDS                          FACILITY:  [facility]   PHYSICIAN:  Corky Crafts, MDDATE OF BIRTH:  1946/06/17   DATE OF PROCEDURE:  06/09/2006  DATE OF DISCHARGE:  06/09/2006                                 OPERATIVE REPORT   PROCEDURES PERFORMED:  Abdominal aortogram, pelvic angiogram, bilateral  lower extremity runoffs.   OPERATOR:  Corky Crafts, MD.   Hilarie Fredrickson NARRATIVE:  The risks and benefits of peripheral angiography were  explained to the patient and informed consent was obtained.  The patient was  brought to the [facility] lab and placed on the table.  He was prepped and draped in  the usual sterile fashion.  His right groin was infiltrated with 1%  lidocaine.  A 5-French arterial sheath was placed into his right femoral  artery using the modified Seldinger technique.  A pigtail catheter was  advanced to the level of the renal arteries.  Under fluoroscopic guidance,  power injection of contrast was used to perform the abdominal aortogram.  The catheter was withdrawn to the level of the aortoiliac bifurcation and a  power injection of contrast was performed to perform the pelvic angiogram.  The lower extremity runoffs were then performed.  Several pressure gradients  were measured using a 4-French end-hole catheter.  The sheath was removed  using manual compression.  The left lower extremity runoff reveals mild  irregularities in the SFA until the mid-to-distal SFA.  There is an 80 to  90% stenosis in the mid-to-distal SFA.  At the level of the popliteal  artery, there is a 70% stenosis.  At the trifurcation below the knee, there  is a 50% ostial anterior tibial stenosis with moderate diffuse disease.  There is mild disease in the tibial and peroneal arteries, both of which are  patent.  The left profunda femoral is also patent.  The right  lower  extremity runoff shows a calcified right common femoral artery.  The  profunda femoris artery is patent as well.  The superficial femoral artery  is patent in the proximal portion.  In the mid-to-distal portion, there is  some moderate diffuse disease.  There is a short segment of 90% stenosis in  the mid-to-distal SFA.  There is patent 3-vessel runoff with mild disease  below the knee on the right.  Abdominal aortogram reveals single renal  arteries to each kidney.  There is mild ostial disease of the renal arteries  bilaterally.  There are no hemodynamically significant stenoses.  Pelvic  angiogram reveals a 50 to 60% proximal right common iliac stenosis with a 10-  mm gradient on pullback of the 4-French catheter.  The left common iliac  artery has a 30 to 40% ostial stenosis.  There is no gradient with pullback.   IMPRESSION:  1. Significant bilateral superficial femoral artery disease.  2. Known left popliteal aneurysm.  3. Moderate right common iliac disease with mild gradient of approximately      10 mm.  4.  Mild left common iliac disease with no gradient.  5. No significant renal artery stenosis.   RECOMMENDATIONS:  1. Will refer to Dr. Madilyn Fireman for a possible fem-pop bypass on the left,      given his popliteal aneurysm.  2. Consider PTA to the short segment of right SFA stenosis at a later      date.  3. Continue aspirin and Plavix.  4. Continue aggressive risk factor modification.      Corky Crafts, MD  Electronically Signed     JSV/MEDQ  D:  06/10/2006  T:  06/10/2006  Job:  [id number]   cc:   Theressa Millard, M.D.  Lyn Records, M.D.  Balinda Quails, M.D.

## 2011-01-18 NOTE — Cardiovascular Report (Signed)
NAMEMARVELL, [name]             ACCOUNT NO.:  [id number]   MEDICAL RECORD NO.:  [id number]          PATIENT TYPE:  OIB   LOCATION:  6501                         FACILITY:  [facility]   PHYSICIAN:  Lyn Records, M.D.   DATE OF BIRTH:  17-Jul-1946   DATE OF PROCEDURE:  04/11/2005  DATE OF DISCHARGE:                              CARDIAC CATHETERIZATION   INDICATIONS FOR PROCEDURE:  [name] has a complicated cardiac history.  He underwent coronary artery bypass grafting initially in 1993 at which time  he had a sequential saphenous vein graft to the first obtuse marginal and  distal circumflex, a saphenous vein graft to the PDA, a saphenous vein graft  to the diagonal and a LIMA to the very distal LAD.  Because of bypass graft  occlusion, he underwent regrafting in 2002 with a free RIMA graft to the  distal LAD and a saphenous vein graft to the PDA.  Over the past several  months to perhaps a year he has developed progressive angina and this  cardiac catheterization is being done to rule out recurrent bypass graft  failure.   PROCEDURE PERFORMED:  1.  Left heart catheterization.  2.  Selective coronary angiography.  3.  Left ventriculography.  4.  Saphenous vein graft angiography.  5.  LIMA and RIMA graft angiography.   DESCRIPTION:  After informed consent, a 4-French sheath was placed in the  right femoral artery using the modified Seldinger technique.  A 4-French A2  multi-purpose catheter was used for hemodynamic recordings, left  ventriculography by hand injection, native right coronary angiography and  saphenous vein graft and free RIMA angiography.  A Judkins #4 4-French  catheter was used for left coronary angiography and a 4-French internal  mammary catheter was used for native right coronary angiography and left  internal mammary graft angiography.  The patient tolerated the procedure  without complications.   RESULTS:  1.  Hemodynamic data:      1.  Aortic pressure  154/71.      2.  Left ventricular pressure 154/19.  2.  Left ventriculography:  Left ventricle was normal in size.      Contractility is normal.  EF is 55%.  No MR.  3.  Coronary angiography.      1.  Left main coronary:  Diffuse disease with distal 70% narrowing.      2.  Left anterior descending coronary:  Totally occluded proximally.      3.  Circumflex artery:  Totally occluded proximally.      4.  Right coronary:  Small diffusely diseased high-grade obstruction in          proximal and mid, total occlusion in mid, two left ventricular          branches arise from it but a threat.  It was felt too small to          bypass and to intervene upon.  4.  Bypass graft angiography.      1.  Saphenous vein graft sequential to OM1 and distal circumflex (2841):  This graft is widely patent except between the first obtuse marginal          and distal circumflex where in the distal body of the graft there is          an 80% eccentric stenosis.      2.  Saphenous vein graft to the PDA (1993):  Totally occluded at the          aortal ostial junction.      3.  Saphenous vein graft to the diagonal (1993):  Widely patent, diffuse          disease is noted in the diagonal beyond the graft insertion.      4.  Saphenous vein graft to the PDA (2001):  This graft is totally          occluded at the aortal ostial junction.      5.  Free RIMA to the distal LAD:  This graft is widely patent.      6.  LIMA to the LAD:  This graft is widely patent.   CONCLUSIONS:  1.  Severe native vessel coronary disease with total occlusion of the left      anterior descending, circumflex and right coronary.  There is severe      diffuse disease in the mid and proximal right coronary artery that      threatens two small right ventricular branches.  2.  Bypass graft occlusive disease with total occlusion of two saphenous      vein grafts to the PDA of the right coronary and high-grade obstruction      in the  sequential saphenous vein graft to the OM1 and distal circumflex      between the first obtuse marginal and the distal circumflex.  This graft      lesion is amenable to PCI.  3.  Patent saphenous vein graft to the diagonal and free RIMA to the distal      left anterior descending.  4.  Patent left internal mammary artery to the distal left anterior      descending.  5.  Normal left ventricular function.   PLAN:  We will set up PCI on the sequential saphenous vein graft to the  distal circumflex for April 23, 2005.      Lyn Records, M.D.  Electronically Signed     HWS/MEDQ  D:  04/11/2005  T:  04/11/2005  Job:  [id number]   cc:   Theressa Millard, M.D.  [address] [place]  [place] [address]  Fax: [phone]

## 2011-01-18 NOTE — Op Note (Signed)
[name], [name]             ACCOUNT NO.:  [id number]   MEDICAL RECORD NO.:  [id number]          PATIENT TYPE:  AMB   LOCATION:  SDS                          FACILITY:  [facility]   PHYSICIAN:  Corky Crafts, MDDATE OF BIRTH:  08-23-1946   DATE OF PROCEDURE:  11/10/2006  DATE OF DISCHARGE:                               OPERATIVE REPORT   REFERRING:  Dr. Verdis Prime and Dr. Benjaman Kindler.   PROCEDURE PERFORMED:  1. Right lower extremity runoff.  2. percutaneous transluminal angioplasty of the right superficial      femoral artery.  3. Stent placement of the right superficial femoral artery.   OPERATORS:  Dr. Eldridge Dace.   INDICATIONS:  Right lower extremity claudication.   PROCEDURE NARRATIVE:  The risks and benefits of peripheral angiography  and intervention were explained to the patient and informed consent was  obtained.  The patient was brought to the PV lab and was prepped and  draped in the usual sterile fashion.  A 6-French arterial sheath was  placed into his right femoral artery after 1% lidocaine was infiltrated  into that area.  The sheath was placed antegrade using the modified  Seldinger technique.  A Wholey wire was placed across the lesion in the  right SFA.  A 5.0 x 20-mm Power balloon was inflated to 10 atm for 39  seconds across the lesion and then again at 10 atm for 19 seconds.  There was a residual 50% stenosis.  The area was heavily calcified.  The  balloon was again inserted and inflated to 14 atm for 39 seconds and  again at 14 atm for 17 seconds.  The lumen was improved, but there was  the possible small area of dissection a 7 x 30-mm EB3 self-expanding  stent was then deployed across the lesion.  A 6 x 20-mm Power balloon  was then inflated within the proximal portion of the stent to 10 atm in  35 seconds.  There is an excellent angiographic result.   OTHER FINDINGS:  The right lower extremity runoff revealed patent 3-  vessel runoff down the leg.   However, there was moderate disease  throughout all three vessels in the mid portion of the calf down to the  foot.   IMPRESSION:  1. 95% right superficial femoral artery lesion, which was      angioplastied.  There was a resulting small dissection.  A 7 x 30-      mm self-expanding stent was then placed.  There is an excellent      angiographic result.  2. Diseased 3-vessel runoff.   RECOMMENDATIONS:  The patient should continue aspirin and Plavix.  Should continue with regular regimen of walking.  I will see the patient  back in the office.     Corky Crafts, MD  Electronically Signed    JSV/MEDQ  D:  11/10/2006  T:  11/10/2006  Job:  [id number]

## 2011-01-18 NOTE — Op Note (Signed)
NAMEDOLPHUS, LINCH             ACCOUNT NO.:  [id number]   MEDICAL RECORD NO.:  [id number]          PATIENT TYPE:  EMS   LOCATION:  MAJO                         FACILITY:  [facility]   PHYSICIAN:  Dionne Ano. Gramig III, M.D.DATE OF BIRTH:  07-20-1946   DATE OF PROCEDURE:  12/24/2005  DATE OF DISCHARGE:                                 OPERATIVE REPORT   I had the pleasure to see [name] today at [facility] Emergency  [facility].  He was referred by [facility].  He presented to  the emergency room for evaluation status post near complete amputation of  his left small finger.  He is 65 years of age, ambidextrous and injured the  left small finger with a post hole digger today.  He notes no other injury.  He denies locking, popping and catching.  Tetanus was given at the urgent  care.  He is alert and oriented and with his wife.   PAST MEDICAL HISTORY:  Type 2 diabetes, hypertension, coronary artery  disease.   ALLERGIES:  PENICILLIN.   MEDICATIONS:  Atenolol, Lipitor, Corgard, baby aspirin, metformin,  hydrochlorothiazide, K-Dur, Accupril, Glucotrol, Plavix, nifedipine,  Pravachol, isosorbide.   PAST SURGICAL HISTORY:  Open heart surgery x2.  Stent placement in October  of 2006.  Knee surgery and spine surgery.   SOCIAL HISTORY:  He does not smoke or drink.  He is retired from the city of  [place].   PHYSICAL EXAMINATION:  GENERAL:  He is alert and oriented and in no acute  distress.  VITAL SIGNS:  Stable.  HEENT:  Within normal limits.  LUNGS:  Clear.  HEART:  Regular rate.  ABDOMEN:  Nontender, obese and soft.   The patient's left hand has near complete amputation about the small finger  with open bony fracture.  He has a large flap laceration and I have reviewed  this with him at length.  PIP and MCP joints area intact.  X-[facility] from [facility] are reviewed which show the distal phalanx fracture, nonarticular  in nature.   IMPRESSION:  Open  distal phalanx fracture, left small finger with volar flap  type abnormality.   PLAN:  I have verbally consented him for incision and drainage and repair of  structures as necessary.   DESCRIPTION OF PROCEDURE:  He was taken to the procedural suite, underwent  an intermetacarpal block, was prepped and draped in the usual sterile  fashion with Betadine scrub and paint.  Underwent copious amount of saline  lavage into the wound.  This was done without complicating features.  Following incision and drainage which excisional in nature, the patient then  underwent reduction of his fracture.  Following this, he underwent a volar  flap reconstruction to the small finger carefully elevating the volar flap  and advancing this.  This was inset with chromic suture.  The patient  tolerated the procedure well.  There were no complicating features.  Following securing the volar flap, the patient then underwent sterile  dressing application followed by finger splint/cast.  He was discharged  to home on Keflex.  He  was given 2g of Ancef in the operative theater and  tolerated this well.  He was instructed on elevation, keeping the area clean  and dry and of course will return to see [place] in 7 to 10 days.  All questions  have been encouraged and answered.           ______________________________  Dionne Ano. Everlene Other, M.D.     Nash Mantis  D:  12/24/2005  T:  12/25/2005  Job:  [id number]   cc:   Theressa Millard, M.D.  Fax: [phone]

## 2011-01-18 NOTE — Discharge Summary (Signed)
[place]  Patient:    [name], [name]                    MRN: [id number] Adm. Date:  [id number] Disc. Date: [id number] Attending:  Cleatrice Burke Dictator:   Eugenia Pancoast, P.A. CC:         Alleen Borne, M.D.             Darci Needle, M.D.             Winn Jock. Earl Gala, M.D.                           Discharge Summary  DATE OF BIRTH:  [birth year].  FINAL DIAGNOSES: 1. Recurrent coronary artery disease. 2. Hypertension. 3. Diabetes mellitus type 2. 4. Hypercholesterolemia.  PROCEDURE:  March 19, 2000, redo coronary artery bypass graft x 2 using left radial artery for the LAD and saphenous vein graft to the posterior descending coronary artery.  SURGEON:  Alleen Borne, M.D.  HISTORY OF PRESENT ILLNESS:  This is a 65 year old obese white male with history of diabetes.  He is status post coronary artery bypass graft x 5 by Delsa Grana. Andrey Campanile, M.D., in May of 1993.  At that time he had an LIMA to the LAD with saphenous vein graft to diagonal, saphenous vein graft to the posterior descending coronary artery, and a sequential saphenous vein graft to the first and second marginal branches.  The patient underwent repeat cardiac catheterization in 1993 and 1994 for recurrent anginal symptoms.  His last catheterization in 1994 showed severe native vessel disease with widely patent grafts and preserved left ventricular function.  Over the past year the patient noted progressive substernal chest pressure and shortness of breath which he was currently having on a daily basis.  Symptoms occurred at rest as well as with exertion.  They could be quite severe at times but were always relieved with sublingual nitroglycerin.  The patients most prominent symptom is shortness of breath.  The patient has also had pain into his neck and jaw as well as down his left arm recently.  He underwent stress Cardiolite scan on February 04, 2000, which  showed a large fixed defect in the mid and distal anterior wall from the base of the apex as well as on the inferior wall from the base of the apex.  There was also new area of ischemia involving anterior basal region and distribution of proximal LAD and diagonal branches.  Left ventricular ejection fraction was 54%.  He underwent a catheterization on February 18, 2000, which showed a 50% diffuse left main narrowing and severe native three vessel disease. The LAD was totally occluded in the mid portion after the second diagonal branch.  There was diffuse disease throughout the proximal LAD with an 80% stenosis.  Left circumflex was totally occluded proximally.  The right coronary artery was totally occluded in the mid portion.  The saphenous vein graft to the two marginal branches was smooth and widely patent.  This was possibly eccentric narrowing between the first and second branches of the obtuse marginal measuring less than 50%.  The saphenous vein graft to the diagonal was widely patent.  The saphenous vein graft to the right coronary artery was occluded at its origin.  The left mammary to the LAD was widely patent but there was an 80% eccentric LAD stenosis just beyond  the distal graft insertion site.  The distal LAD gave collaterals around the apex and _____ diagonal and septal perforators to occluded distal right coronary artery.  Because of these findings and his current condition, surgery was discussed.  Risks and benefits of the surgery were explained to the patient and the patient understood and agreed to surgery.  HOSPITAL COURSE: The patient was admitted to [facility] on March 19, 2000.  At that time he underwent a redo coronary artery bypass graft x 2 with the left radial artery used to bypass distal LAD.  A saphenous vein graft was used for the posterior descending coronary artery.  The patient tolerated the procedure well with no intraoperative  complications. Postoperatively the patient was extubated on the operative day.  On the first postoperative day the patient continued to do well.  He was afebrile with stable vital signs. Blood pressure was 135/65. At this time he was prepared for transfer to the step-down unit.  The patient was subsequently transferred to the step-down unit and there he continued to progress in a satisfactory manner.  No untoward events occurred during his stay.  He apparently remained in normal sinus rhythm. He went through his cardiac rehab phase without difficulty.  His diet was advanced as tolerated.  He continued to progress well.  Incisions were all healing satisfactorily and he was doing well.  LAB:  His final lab work showed his white cell count was 5.7, hemoglobin 9.7, hematocrit 26.8 and platelets were 133,000.  His sodium was 132, potassium 3.5, chloride 103, CO2 25, BUN 14, creatinine 0.7, blood sugars were running approximately 124 to 152.  He was subsequently prepared for discharge.  DISCHARGE MEDICATIONS: 1. Lasix 40 mg q.d. 2. K-Dur 20 mEq q.d. 3. Pravachol 20 mg q.d. 4. Glucotrol XL 10 mg q.d. 5. Enteric coated aspirin 325 mg  q.d. 6. Altace 2.5 mg q.12h. 7. Lopressor 50 mg one half tablet q.8h. 8. Tylox one to two p.o. q.4-6h. p.r.n. pain.  FOLLOW-UP:  The patient will follow up with Dr. Laneta Simmers in three weeks.  He will see Darci Needle, M.D., in two weeks.  DISPOSITION:  The patient was subsequently discharged to home in satisfactory and stable condition on March 25, 2000. DD:  03/24/00 TD:  03/27/00 Job: [id number]

## 2011-01-18 NOTE — Cardiovascular Report (Signed)
[employer]. [facility]  Patient:    DOYEL, MULKERN                   MRN: [id number] Proc. Date: 02/18/00 Attending:  Darci Needle, M.D. CC:         Alleen Borne, M.D.             Winn Jock. Earl Gala, M.D.             Cardiac Catheterization Laboratory                        Cardiac Catheterization  CINE NUMBER:  [id number]  INDICATIONS:  Mr. Klemann is 43 and has a history of diabetes, and is status post coronary artery bypass grafting in 1993.  He is undergoing coronary angiography and bypass graft angiography because of progressive dyspnea and evidence of anterior basal ischemia on Cardiolite.  PROCEDURES PERFORMED: 1. Left heart catheterization. 2. Selective coronary angiography. 3. Left ventriculography. 4. Saphenous vein graft angiography. 5. Left internal mammary artery graft angiography.  DESCRIPTION OF PROCEDURE:  After informed consent, the patient was brought to the catheterization lab where a 6 French sheath was started in the right femoral artery using a modified Seldinger technique.  A 6 French A2 multipurpose catheter was used to performed left ventriculography, selective left and right coronary angiography, and saphenous vein graft angiography.  A 6 French internal mammary catheter was used for left internal mammary artery angiography.  The patient tolerated the procedure without significant complications.  RESULTS:  I:  HEMODYNAMIC DATA:     a. The aortic pressure 156/81 mmHg.     b. Left ventricular pressure 156/19 mmHg.  II:  LEFT VENTRICULOGRAPHY:  The left ventricle is mildly dilated.  There is apical severe hypokinesis.  Overall ejection fraction is estimated to be 50%. No significant mitral regurgitation is noted.  III:  SELECTIVE CORONARY ANGIOGRAPHY:     a. Left main:  Diffuse 50% narrowing.     b. Left anterior descending coronary artery:  The left anterior        descending coronary artery is totally occluded  in the mid vessel        after the second diagonal.  There is diffuse disease throughout the        proximal LAD with up to 80% stenosis noted in at least two sites.  The        vessel is severely tortuous.     c. Circumflex artery:  Totally occluded proximally.     d. Right coronary artery:  This appears to be a nondominant vessel that is        totally occluded in the mid vessel.  IV:  BYPASS GRAFT ANGIOGRAPHY:     a. Sequential saphenous vein graft to the first and second obtuse        marginal.  The graft is widely patent except between the first and        second obtuse marginal where there is a 50% eccentric stenosis.     b. Saphenous vein graft to the first diagonal, widely patent.     c. Saphenous vein graft to the right coronary, totally occluded at the        aorto-ostial junction.     d. Left internal mammary graft to the LAD.  This graft is widely patent.        The LAD distal to the graft insertion is  diffusely diseased and felt        to be less than 2.5 mm in diameter.  In the region immediately distal        to the graft insertion site, there is an 80% eccentric LAD stenosis.        The distal LAD gives collaterals around the apex and through a diagonal        and septal perforator to the occluded distal right coronary.  CONCLUSIONS: 1. Mild to moderate left ventricular dysfunction. 2. Severe native vessel coronary disease with total occlusion of the LAD,    circumflex, and distal right coronary. 3. Bypass graft occlusion with total occlusion of the saphenous vein graft to    the right coronary, mild disease in the saphenous vein graft to the second    obtuse marginal, but otherwise widely patent grafts including the left    internal mammary artery graft. 4. Severe diffuse disease involving the LAD distal to the graft insertion in    the mid LAD.  The diseased LAD segment is diffuse and contains an    eccentric 70-80% stenosis.  RECOMMENDATIONS:  Review cine.   Consider angioplasty on the LAD versus repeat bypass surgery.  Angioplasty will be associated with a high re-stenosis rate and will be relatively high risk since it gives origin to collaterals to the right coronary. DD:  02/18/00 TD:  02/19/00 Job: [id number]

## 2011-01-18 NOTE — Cardiovascular Report (Signed)
NAMEARMONTE, TORTORELLA             ACCOUNT NO.:  [id number]   MEDICAL RECORD NO.:  [id number]          PATIENT TYPE:  OIB   LOCATION:  6529                         FACILITY:  [facility]   PHYSICIAN:  Lyn Records, M.D.   DATE OF BIRTH:  11-08-1945   DATE OF PROCEDURE:  04/23/2005  DATE OF DISCHARGE:                              CARDIAC CATHETERIZATION   INDICATIONS FOR PROCEDURE:  Abnormal Cardiolite study, history of coronary  bypass grafting, bypass graft occlusion.   PROCEDURE PERFORMED:  Direct stent distal graft to the circumflex/obtuse  marginal branches.   DESCRIPTION:  After informed consent, 6-French sheath was started in the  right femoral artery using the modified Seldinger technique. The patient had  been preloaded with 600 mg of Plavix. A Amplatz I left 6-French guide  catheter was used to obtain guiding shots. A double bolus followed by an  infusion of Integrilin and a bolus of heparin was administered. A heparin  bolus was 6700 units. ACT was documented to be greater than 250.   After obtaining guiding shots, the BMW wire was used to cross into the  distal circumflex via the saphenous vein graft and through the lesion. We  then direct stented using a TAXUS 3.5 mm diameter stent x 16 mm long. We  then post dilated with a 4.0 x 12 mm long Quantum balloon to 12 atmospheres.  The patient tolerated procedure without complications. TIMI grade 3 flow was  noted post procedure. No evidence of distal embolization was noted.   AngioSeal arteriotomy closure was performed after documentation of adequate  anatomy.   CONCLUSION:  Successful direct stenting of the distal graft to the  circumflex territory from 85% to 0% with TIMI grade 3 flow using a TAXUS  stent post dilated to 4 mm in diameter.   PLAN:  Aspirin and Plavix. Integrilin x12 hours. Ambulate in two to three  hours. Discharge April 24, 2005.      Lyn Records, M.D.  Electronically Signed     HWS/MEDQ  D:   04/23/2005  T:  04/23/2005  Job:  [id number]   cc:   Theressa Millard, M.D.  [address] [place]  [place] [address]  Fax: [phone]

## 2011-04-11 ENCOUNTER — Encounter: Payer: Self-pay | Admitting: Vascular Surgery

## 2011-04-23 ENCOUNTER — Encounter: Payer: Self-pay | Admitting: Vascular Surgery

## 2011-05-22 ENCOUNTER — Encounter: Payer: Self-pay | Admitting: Vascular Surgery

## 2011-05-23 ENCOUNTER — Encounter (INDEPENDENT_AMBULATORY_CARE_PROVIDER_SITE_OTHER): Payer: 59 | Admitting: *Deleted

## 2011-05-23 ENCOUNTER — Ambulatory Visit (INDEPENDENT_AMBULATORY_CARE_PROVIDER_SITE_OTHER): Payer: 59 | Admitting: Vascular Surgery

## 2011-05-23 ENCOUNTER — Encounter: Payer: Self-pay | Admitting: Vascular Surgery

## 2011-05-23 VITALS — BP 128/72 | HR 50 | Resp 12 | Ht 70.0 in | Wt 233.0 lb

## 2011-05-23 DIAGNOSIS — Z9889 Other specified postprocedural states: Secondary | ICD-10-CM

## 2011-05-23 DIAGNOSIS — Z48812 Encounter for surgical aftercare following surgery on the circulatory system: Secondary | ICD-10-CM

## 2011-05-23 DIAGNOSIS — I739 Peripheral vascular disease, unspecified: Secondary | ICD-10-CM

## 2011-05-23 NOTE — Progress Notes (Signed)
[facility] [place] HISTORY AND PHYSICAL   ZO:XWRUEA up left leg bypass Referring Physician:James Earl Gala, MD  History of Present Illness:  Patient is a 65 y.o. 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 This bypass was done in 2008 by Dr. Madilyn Fireman. This was done with a vein graft. He previously had vein harvested and radial artery harvested for coronary bypass. He occasionally has some right calf cramps but these did not really somewhat claudication. Of note he has had a previous right superficial femoral artery stent by Dr. Eldridge Dace also in 2008. Patient also has a history of atrial fibrillation. He is on Coumadin.  Past Medical History  Diagnosis Date  . Arthritis   . Reflux   . Leg pain 03/17/2009    With walking  . Chest pain   . Hypertension   . Hyperlipidemia   . Myocardial infarction   . CHF (congestive heart failure)   . Diabetes mellitus Age 71  . Popliteal aneurysm     repaired left side Dr Madilyn Fireman 2008    Past Surgical History  Procedure Date  . Pr vein bypass graft,aorto-fem-pop   . Back surgery 1994    L5 discectomy  . Brachiocephalic vein angioplasty / stenting 2006    ROS: [x]  Positive   [ ]  Negative   Arly.Keller ] All sytems reviewed and are negative  General:[ ]  Weight loss, [ ]  Fever, [ ]  chills Neurologic: [ ]  Dizziness, [ ]  Blackouts, [ ]  Seizure [ ]  Stroke, [ ]  "Mini stroke", [ ]  Slurred speech, [ ]  Temporary blindness;  [ ] weakness,  [ ]  Hoarseness Cardiac: [ ]  Chest pain/pressure, [ ]  Shortness of breath at rest [ ]  Shortness of breath with exertion,  [ ]   Atrial fibrillation or irregular heartbeat Vascular:[ ]  Pain in legs with walking, [ ]  Pain in legs at rest ,[ ]  Pain in legs at night,  [ ]   Non-healing ulcer, [ ]  Blood clot in vein/DVT,   Pulmonary: [ ]  Home oxygen, [ ]   Productive cough, [ ]  Coughing up blood,  [ ]  Asthma,  [ ]  Wheezing Musculoskeletal:  [ ]   Arthritis, [ ]  Low back pain,  [ ]  Joint pain Hematologic:[ ]  Easy Bruising, [ ]  Anemia; [ ]  Hepatitis Gastrointestinal: [ ]  Blood in stool,  [ ]  Gastroesophageal Reflux, [ ]  Trouble swallowing Urinary: [ ]  chronic Kidney disease, [ ]  on HD - [ ]  MWF or [ ]  TTHS, [ ]  Burning with urination, [ ]  Frequent urination, [ ]  Difficulty urinating;  Skin: [ ]  Rashes, [ ]  Wounds Psychological: [ ]  Anxiety,  [ ]  Depression  Social History History  Substance Use Topics  . Smoking status: Former Smoker    Types: Cigarettes    Quit date: 09/03/1983  . Smokeless tobacco: Not on file  . Alcohol Use: No    Family History Family History  Problem Relation Age of Onset  . Heart disease Other     Allergies  Allergies  Allergen Reactions  . Penicillins      Current Outpatient Prescriptions  Medication Sig Dispense Refill  . amiodarone (PACERONE) 200 MG tablet Take 200 mg by mouth daily.        Marland Kitchen atorvastatin (LIPITOR) 40 MG tablet Take 40 mg by mouth daily.        . carvedilol (COREG) 25 MG tablet Take 25 mg by mouth 2 (two) times daily  with a meal.        . clopidogrel (PLAVIX) 75 MG tablet Take 75 mg by mouth daily.        . furosemide (LASIX) 40 MG tablet Take 80 mg by mouth daily.       . isosorbide mononitrate (IMDUR) 120 MG 24 hr tablet Take 120 mg by mouth daily.        . metFORMIN (GLUMETZA) 500 MG (MOD) 24 hr tablet Take 500 mg by mouth 2 (two) times daily with a meal.        . NIFEdipine (PROCARDIA XL/ADALAT-CC) 90 MG 24 hr tablet Take 90 mg by mouth daily.        . nitroGLYCERIN (NITROSTAT) 0.4 MG SL tablet Place 0.4 mg under the tongue every 5 (five) minutes as needed.        Marland Kitchen POTASSIUM CHLORIDE PO Take 20 mg by mouth daily.        Marland Kitchen telmisartan (MICARDIS) 80 MG tablet Take 80 mg by mouth daily.        Marland Kitchen warfarin (COUMADIN) 5 MG tablet Take 5 mg by mouth daily.        Marland Kitchen aspirin 81 MG tablet Take 81 mg by mouth daily.        Marland Kitchen glipiZIDE (GLUCOTROL XL) 10 MG 24 hr tablet Take 10 mg  by mouth daily.        . valsartan (DIOVAN) 320 MG tablet Take 320 mg by mouth daily.          Physical Examination  Filed Vitals:   05/23/11 1025  BP: 128/72  Pulse: 50  Resp: 12    Body mass index is 33.43 kg/(m^2).  General:  Alert and oriented, no acute distress HEENT: Normal Neck: No bruit or JVD Pulmonary: Clear to auscultation bilaterally Cardiac: Regular Rate and Rhythm without murmur Gastrointestinal: Soft, non-tender, non-distended, no mass, no scars Skin: No rash Extremity Pulses:  2+ radial, brachial, femoral, dorsalis pedis pulses bilaterally Musculoskeletal: No deformity or edema  Neurologic: Upper and lower extremity motor 5/5 and symmetric  DATA: Noninvasive vascular exam shows a patent left lower extremity above-knee to below-knee popliteal bypass with no increased velocities ABIs are greater than 1 bilaterally   ASSESSMENT:  Doing well status post repair of left popliteal aneurysm. The patient had a CT scan of abdomen and pelvis in 2007 which showed no abdominal aortic aneurysm. He currently is asymptomatic.   PLAN: #1 followup in one year with repeat vascular lab exam  #2 followup with M.D. in 2 years

## 2011-05-23 NOTE — Progress Notes (Signed)
Addended by: Sharee Pimple on: 05/23/2011 01:37 PM   Modules accepted: Orders

## 2011-05-31 NOTE — Procedures (Unsigned)
BYPASS GRAFT EVALUATION  INDICATION:  Follow up bypass graft.  HISTORY: Diabetes:  Yes. Cardiac:  No. Hypertension:  Yes. Smoking:  Previous. Previous Surgery:  Left SFA to popliteal artery bypass graft with reverse saphenous vein, 09/2006, by Dr. Madilyn Fireman.  SINGLE LEVEL ARTERIAL EXAM                              RIGHT              LEFT Brachial:                    132                135 Anterior tibial:             153                193 Posterior tibial:            142                149 Peroneal: Ankle/brachial index:        1.13 (TBI 0.73)    1.43 (TBI 0.93)  PREVIOUS ABI:  Date: 03/17/2009  RIGHT:  0.85  LEFT:  1.03  LOWER EXTREMITY BYPASS GRAFT DUPLEX EXAM:  DUPLEX:  Patent left femoral to popliteal bypass graft without evidence of stenosis.  IMPRESSION: 1. Ankle brachial indices are within normal limits bilaterally,     although maybe suggestive of calcified vessels. 2. Patent left femoral-popliteal bypass graft with velocity     measurements shown on the following worksheet.  ___________________________________________ Janetta Hora. Darrick Penna, MD  EM/MEDQ  D:  05/23/2011  T:  05/23/2011  Job:  [id number]

## 2011-08-19 ENCOUNTER — Other Ambulatory Visit: Payer: Self-pay | Admitting: Internal Medicine

## 2011-08-19 DIAGNOSIS — E0580 Other thyrotoxicosis without thyrotoxic crisis or storm: Secondary | ICD-10-CM

## 2011-08-28 ENCOUNTER — Ambulatory Visit
Admission: RE | Admit: 2011-08-28 | Discharge: 2011-08-28 | Disposition: A | Discharge status: Home / Self Care | Payer: 59 | Source: Ambulatory Visit | Attending: Internal Medicine | Admitting: Internal Medicine

## 2011-08-28 DIAGNOSIS — E0580 Other thyrotoxicosis without thyrotoxic crisis or storm: Secondary | ICD-10-CM

## 2011-09-13 ENCOUNTER — Encounter (INDEPENDENT_AMBULATORY_CARE_PROVIDER_SITE_OTHER): Payer: Self-pay | Admitting: Surgery

## 2011-09-16 ENCOUNTER — Encounter (INDEPENDENT_AMBULATORY_CARE_PROVIDER_SITE_OTHER): Payer: Self-pay | Admitting: Surgery

## 2011-09-16 ENCOUNTER — Ambulatory Visit (INDEPENDENT_AMBULATORY_CARE_PROVIDER_SITE_OTHER): Payer: 59 | Admitting: Surgery

## 2011-09-16 VITALS — BP 118/62 | HR 70 | Temp 96.9°F | Resp 18 | Ht 70.0 in | Wt 209.8 lb

## 2011-09-16 DIAGNOSIS — E0590 Thyrotoxicosis, unspecified without thyrotoxic crisis or storm: Secondary | ICD-10-CM

## 2011-09-16 NOTE — Patient Instructions (Signed)
Continue follow up with Dr. Sharl Ma.  We will monitor lab work with him. tmg

## 2011-09-16 NOTE — Progress Notes (Signed)
Chief Complaint  Patient presents with  . New Evaluation    Eval multinodular goiter - hyperthyroidism - referral by Dr. Debara Pickett   HISTORY: Patient is a 66 year old white male referred by his endocrinologist for surgical assessment due to potential need for thyroidectomy for management of amiodarone-induced hyperthyroidism. Patient developed symptoms in early December 2012. He had a normal TSH level in the summer of 2012 at 1.77. His TSH level in early December 2012 failed to 0.01. Free T4 levels were markedly elevated. Patient had been taking amiodarone for approximately 2 years. This medication was discontinued and the patient was placed on methimazole.  However due to suppression of his bone marrow, the anti-thyroid medication had to be discontinued. Patient is now taking prednisone and cholestyramine. Patient is referred today for evaluation in case of need for thyroidectomy for control of hyperthyroidism.  Patient has no prior history of thyroid disease. He did undergo a thyroid ultrasound in late December showing a mildly enlarged thyroid gland with heterogeneous parenchyma. There is a solitary 12 mm nodule in the left upper pole. There is a 9 mm nodule in the left inferior pole. There is a 7 mm nodule in the right inferior pole.  Patient has had no prior head or neck surgery.  There is no family history of thyroid disease or other endocrinopathy.  Past Medical History  Diagnosis Date  . Reflux   . Leg pain 03/17/2009    With walking  . Chest pain   . Hypertension   . Hyperlipidemia   . Myocardial infarction   . CHF (congestive heart failure)   . Diabetes mellitus Age 83  . Popliteal aneurysm     repaired left side Dr Madilyn Fireman 2008  . PAD (peripheral artery disease)     Right SFA stent  . Hx of CABG 1993  . Ischemic cardiomyopathy   . Afib   . Retinopathy   . PVD (peripheral vascular disease)   . Tubular adenoma   . Arthritis     in back  . Heart attack   . Weight loss,  unintentional   . Trouble swallowing   . Change in voice   . Cough   . Wheezing   . Abdominal pain   . Weakness   . Generalized headaches   . Rash   . Bruises easily   . Diarrhea   . Leg swelling   . Hyperthyroidism      Current Outpatient Prescriptions  Medication Sig Dispense Refill  . cholestyramine (QUESTRAN) 4 G packet Take 1 packet by mouth daily.      Marland Kitchen levofloxacin (LEVAQUIN) 500 MG tablet Take 500 mg by mouth daily.      . predniSONE (DELTASONE) 5 MG tablet Take 5 mg by mouth daily.      Marland Kitchen atorvastatin (LIPITOR) 40 MG tablet Take 40 mg by mouth daily.        . carvedilol (COREG) 25 MG tablet Take 25 mg by mouth 2 (two) times daily with a meal.        . clopidogrel (PLAVIX) 75 MG tablet Take 75 mg by mouth daily.        . furosemide (LASIX) 40 MG tablet Take 80 mg by mouth daily.       . isosorbide mononitrate (IMDUR) 120 MG 24 hr tablet Take 120 mg by mouth daily.        . metFORMIN (GLUMETZA) 500 MG (MOD) 24 hr tablet Take 500 mg by mouth 2 (two) times daily  with a meal.        . NIFEdipine (PROCARDIA XL/ADALAT-CC) 90 MG 24 hr tablet Take 90 mg by mouth daily.        . nitroGLYCERIN (NITROSTAT) 0.4 MG SL tablet Place 0.4 mg under the tongue every 5 (five) minutes as needed.        Marland Kitchen POTASSIUM CHLORIDE PO Take 20 mg by mouth daily.        Marland Kitchen telmisartan (MICARDIS) 80 MG tablet Take 80 mg by mouth daily.        Marland Kitchen warfarin (COUMADIN) 5 MG tablet Take 5 mg by mouth daily.           Allergies  Allergen Reactions  . Erythromycin     Patient unsure of reaction, was told he was allergic.  Marland Kitchen Penicillins Rash    Happened in childhood.     Family History  Problem Relation Age of Onset  . Heart disease Other   . Cancer Father     stomach  . Cancer Mother     ovarian  . Cancer Maternal Aunt     breast     History   Social History  . Marital Status: Married    Spouse Name: N/A    Number of Children: N/A  . Years of Education: N/A   Social History Main Topics   . Smoking status: Former Smoker    Types: Cigarettes    Quit date: 09/03/1983  . Smokeless tobacco: Never Used  . Alcohol Use: No  . Drug Use: No  . Sexually Active: None   Other Topics Concern  . None   Social History Narrative  . None   REVIEW OF SYSTEMS - PERTINENT POSITIVES ONLY: Mild tremor. Interval development of hoarseness beginning early December 2012. No palpitations. No dysphagia.   EXAM: Filed Vitals:   09/16/11 1320  BP: 118/62  Pulse: 70  Temp: 96.9 F (36.1 C)  Resp: 18    HEENT: normocephalic; pupils equal and reactive; sclerae clear; dentition good; mucous membranes moist NECK:  Soft, nodular thyroid without dominant mass; symmetric on extension; no palpable anterior or posterior cervical lymphadenopathy; no supraclavicular masses; no tenderness CHEST: clear to auscultation bilaterally without rales, rhonchi, or wheezes CARDIAC: regular rate and rhythm without significant murmur; peripheral pulses are weak EXT:  non-tender with mild to moderate edema; no deformity NEURO: no gross focal deficits; no sign of tremor   LABORATORY RESULTS: See E-Chart for most recent results   RADIOLOGY RESULTS: See E-Chart or I-Site for most recent results   IMPRESSION: Amiodarone-induced hyperthyroidism, multinodular thyroid  PLAN: I discussed the above issues with the patient and his wife. I have already reviewed the case by telephone with his endocrinologist. Patient has been taken off of anti-thyroid medication due to bone marrow suppression. He is now taking steroids and cholestyramine. He is also on prophylactic antibiotics.  Today we reviewed written literature regarding total thyroidectomy. I provided them with a copy of a brochure. We discussed the risk and benefits of total thyroidectomy including the potential for recurrent laryngeal nerve injury, permanent voice changes, and injury to parathyroid glands with alteration and calcium metabolism. They  understand.  Hopefully the patient will avoid surgical intervention. However, in the event of failure of medical management, the patient could undergo total thyroidectomy. This would require coordination with his cardiologist and the anesthesia team. Certainly the patient is at elevated risk for surgical intervention given his cardiac history and his complications related to his amiodarone therapy.  At this point we will monitor his endocrine evaluation and be ready if surgical intervention becomes a necessity.  Velora Heckler, MD, FACS General & Endocrine Surgery [facility]   Visit Diagnoses: 1. Hyperthyroidism, induced by amiodarone     Primary Care Physician: Darnelle Bos, MD, MD  Endocrinology:  Dr. Debara Pickett Cardiology:  Dr. Garnette Scheuermann

## 2011-09-27 ENCOUNTER — Other Ambulatory Visit (INDEPENDENT_AMBULATORY_CARE_PROVIDER_SITE_OTHER): Payer: Self-pay | Admitting: Surgery

## 2011-10-01 ENCOUNTER — Encounter (INDEPENDENT_AMBULATORY_CARE_PROVIDER_SITE_OTHER): Payer: Self-pay

## 2011-11-21 ENCOUNTER — Other Ambulatory Visit: Payer: 59

## 2012-05-21 ENCOUNTER — Encounter (INDEPENDENT_AMBULATORY_CARE_PROVIDER_SITE_OTHER): Payer: 59 | Admitting: *Deleted

## 2012-05-21 DIAGNOSIS — I739 Peripheral vascular disease, unspecified: Secondary | ICD-10-CM

## 2012-05-21 DIAGNOSIS — Z48812 Encounter for surgical aftercare following surgery on the circulatory system: Secondary | ICD-10-CM

## 2012-05-21 DIAGNOSIS — Z9889 Other specified postprocedural states: Secondary | ICD-10-CM

## 2012-05-22 ENCOUNTER — Other Ambulatory Visit: Payer: Self-pay | Admitting: *Deleted

## 2012-05-22 DIAGNOSIS — Z48812 Encounter for surgical aftercare following surgery on the circulatory system: Secondary | ICD-10-CM

## 2012-05-22 DIAGNOSIS — I739 Peripheral vascular disease, unspecified: Secondary | ICD-10-CM

## 2012-05-26 ENCOUNTER — Encounter: Payer: Self-pay | Admitting: Vascular Surgery

## 2012-06-15 ENCOUNTER — Other Ambulatory Visit: Payer: Self-pay | Admitting: Internal Medicine

## 2012-06-15 DIAGNOSIS — E049 Nontoxic goiter, unspecified: Secondary | ICD-10-CM

## 2012-06-17 ENCOUNTER — Ambulatory Visit
Admission: RE | Admit: 2012-06-17 | Discharge: 2012-06-17 | Disposition: A | Discharge status: Home / Self Care | Payer: 59 | Source: Ambulatory Visit | Attending: Internal Medicine | Admitting: Internal Medicine

## 2012-06-17 DIAGNOSIS — E049 Nontoxic goiter, unspecified: Secondary | ICD-10-CM

## 2012-10-14 ENCOUNTER — Emergency Department (HOSPITAL_COMMUNITY): Payer: 59

## 2012-10-14 ENCOUNTER — Encounter (HOSPITAL_COMMUNITY): Payer: Self-pay | Admitting: Emergency Medicine

## 2012-10-14 ENCOUNTER — Observation Stay (HOSPITAL_COMMUNITY)
Admission: EM | Admit: 2012-10-14 | Discharge: 2012-10-15 | Disposition: A | Discharge status: Home / Self Care | Payer: 59 | Attending: Internal Medicine | Admitting: Internal Medicine

## 2012-10-14 DIAGNOSIS — E11319 Type 2 diabetes mellitus with unspecified diabetic retinopathy without macular edema: Secondary | ICD-10-CM | POA: Insufficient documentation

## 2012-10-14 DIAGNOSIS — Y92009 Unspecified place in unspecified non-institutional (private) residence as the place of occurrence of the external cause: Secondary | ICD-10-CM | POA: Insufficient documentation

## 2012-10-14 DIAGNOSIS — I739 Peripheral vascular disease, unspecified: Secondary | ICD-10-CM | POA: Diagnosis present

## 2012-10-14 DIAGNOSIS — E1151 Type 2 diabetes mellitus with diabetic peripheral angiopathy without gangrene: Secondary | ICD-10-CM | POA: Diagnosis present

## 2012-10-14 DIAGNOSIS — E0580 Other thyrotoxicosis without thyrotoxic crisis or storm: Secondary | ICD-10-CM | POA: Insufficient documentation

## 2012-10-14 DIAGNOSIS — IMO0002 Reserved for concepts with insufficient information to code with codable children: Secondary | ICD-10-CM | POA: Insufficient documentation

## 2012-10-14 DIAGNOSIS — M25559 Pain in unspecified hip: Secondary | ICD-10-CM | POA: Insufficient documentation

## 2012-10-14 DIAGNOSIS — I10 Essential (primary) hypertension: Secondary | ICD-10-CM | POA: Insufficient documentation

## 2012-10-14 DIAGNOSIS — R269 Unspecified abnormalities of gait and mobility: Secondary | ICD-10-CM | POA: Insufficient documentation

## 2012-10-14 DIAGNOSIS — Z7901 Long term (current) use of anticoagulants: Secondary | ICD-10-CM | POA: Insufficient documentation

## 2012-10-14 DIAGNOSIS — W19XXXA Unspecified fall, initial encounter: Secondary | ICD-10-CM | POA: Insufficient documentation

## 2012-10-14 DIAGNOSIS — E1149 Type 2 diabetes mellitus with other diabetic neurological complication: Secondary | ICD-10-CM | POA: Insufficient documentation

## 2012-10-14 DIAGNOSIS — T462X5A Adverse effect of other antidysrhythmic drugs, initial encounter: Secondary | ICD-10-CM | POA: Insufficient documentation

## 2012-10-14 DIAGNOSIS — R55 Syncope and collapse: Principal | ICD-10-CM | POA: Diagnosis present

## 2012-10-14 DIAGNOSIS — I70219 Atherosclerosis of native arteries of extremities with intermittent claudication, unspecified extremity: Secondary | ICD-10-CM | POA: Diagnosis present

## 2012-10-14 DIAGNOSIS — D72819 Decreased white blood cell count, unspecified: Secondary | ICD-10-CM | POA: Diagnosis present

## 2012-10-14 DIAGNOSIS — I509 Heart failure, unspecified: Secondary | ICD-10-CM | POA: Insufficient documentation

## 2012-10-14 DIAGNOSIS — D696 Thrombocytopenia, unspecified: Secondary | ICD-10-CM | POA: Diagnosis present

## 2012-10-14 DIAGNOSIS — E0590 Thyrotoxicosis, unspecified without thyrotoxic crisis or storm: Secondary | ICD-10-CM | POA: Diagnosis present

## 2012-10-14 DIAGNOSIS — E1139 Type 2 diabetes mellitus with other diabetic ophthalmic complication: Secondary | ICD-10-CM | POA: Diagnosis present

## 2012-10-14 DIAGNOSIS — H8110 Benign paroxysmal vertigo, unspecified ear: Secondary | ICD-10-CM | POA: Diagnosis present

## 2012-10-14 DIAGNOSIS — E113299 Type 2 diabetes mellitus with mild nonproliferative diabetic retinopathy without macular edema, unspecified eye: Secondary | ICD-10-CM | POA: Diagnosis present

## 2012-10-14 DIAGNOSIS — I2510 Atherosclerotic heart disease of native coronary artery without angina pectoris: Secondary | ICD-10-CM | POA: Diagnosis present

## 2012-10-14 DIAGNOSIS — I252 Old myocardial infarction: Secondary | ICD-10-CM | POA: Insufficient documentation

## 2012-10-14 LAB — COMPREHENSIVE METABOLIC PANEL
ALT: 20 U/L (ref 0–53)
AST: 22 U/L (ref 0–37)
CO2: 22 mEq/L (ref 19–32)
Calcium: 9.9 mg/dL (ref 8.4–10.5)
Creatinine, Ser: 0.81 mg/dL (ref 0.50–1.35)
GFR calc Af Amer: >90 mL/min (ref >90)
GFR calc non Af Amer: >90 mL/min (ref >90)
Glucose, Bld: 117 mg/dL — ABNORMAL HIGH (ref 70–99)
Sodium: 140 mEq/L (ref 135–145)
Total Protein: 7.6 g/dL (ref 6.0–8.3)

## 2012-10-14 LAB — PROTIME-INR
INR: 2.28 — ABNORMAL HIGH (ref 0.00–1.49)
Prothrombin Time: 24.1 seconds — ABNORMAL HIGH (ref 11.6–15.2)

## 2012-10-14 LAB — CBC WITH DIFFERENTIAL/PLATELET
Basophils Absolute: 0 10*3/uL (ref 0.0–0.1)
Eosinophils Absolute: 0.2 10*3/uL (ref 0.0–0.7)
Eosinophils Relative: 5 % (ref 0–5)
HCT: 39.4 % (ref 39.0–52.0)
MCH: 30.2 pg (ref 26.0–34.0)
MCV: 85.1 fL (ref 78.0–100.0)
Monocytes Absolute: 0.4 10*3/uL (ref 0.1–1.0)
Platelets: 115 10*3/uL — ABNORMAL LOW (ref 150–400)
RDW: 13.3 % (ref 11.5–15.5)

## 2012-10-14 LAB — GLUCOSE, CAPILLARY
Glucose-Capillary: 164 mg/dL — ABNORMAL HIGH (ref 70–99)
Glucose-Capillary: 137 mg/dL — ABNORMAL HIGH (ref 70–99)

## 2012-10-14 LAB — POCT I-STAT, CHEM 8
BUN: 11 mg/dL (ref 6–23)
Calcium, Ion: 1.14 mmol/L (ref 1.13–1.30)
HCT: 40 % (ref 39.0–52.0)
Hemoglobin: 13.6 g/dL (ref 13.0–17.0)
Sodium: 141 mEq/L (ref 135–145)
TCO2: 23 mmol/L (ref 0–100)

## 2012-10-14 LAB — POCT I-STAT TROPONIN I: Troponin i, poc: 0 ng/mL (ref 0.00–0.08)

## 2012-10-14 MED ORDER — ACETAMINOPHEN 650 MG RE SUPP: 650.0000 mg | Freq: Four times a day (QID) | RECTAL | Status: DC | PRN

## 2012-10-14 MED ORDER — SODIUM CHLORIDE 0.9 % IJ SOLN: 3.0000 mL | INTRAMUSCULAR | Status: DC | PRN

## 2012-10-14 MED ORDER — SODIUM CHLORIDE 0.9 % IJ SOLN
3.0000 mL | Freq: Two times a day (BID) | INTRAMUSCULAR | Status: DC
  Administered 2012-10-14: 3 mL via INTRAVENOUS

## 2012-10-14 MED ORDER — SODIUM CHLORIDE 0.9 % IJ SOLN: 3.0000 mL | Freq: Two times a day (BID) | INTRAMUSCULAR | Status: DC

## 2012-10-14 MED ORDER — INSULIN ASPART 100 UNIT/ML ~~LOC~~ SOLN: 0.0000 [IU] | Freq: Three times a day (TID) | SUBCUTANEOUS | Status: DC

## 2012-10-14 MED ORDER — WARFARIN SODIUM 7.5 MG PO TABS
7.5000 mg | ORAL_TABLET | Freq: Every day | ORAL | Status: DC
  Administered 2012-10-14: 7.5 mg via ORAL
  Filled 2012-10-14 (×2): qty 1

## 2012-10-14 MED ORDER — WARFARIN - PHYSICIAN DOSING INPATIENT: Freq: Every day | Status: DC

## 2012-10-14 MED ORDER — CLOPIDOGREL BISULFATE 75 MG PO TABS
75.0000 mg | ORAL_TABLET | Freq: Every day | ORAL | Status: DC
  Administered 2012-10-14 – 2012-10-15 (×2): 75 mg via ORAL
  Filled 2012-10-14 (×2): qty 1

## 2012-10-14 MED ORDER — INSULIN ASPART 100 UNIT/ML ~~LOC~~ SOLN: 0.0000 [IU] | Freq: Every day | SUBCUTANEOUS | Status: DC

## 2012-10-14 MED ORDER — NIFEDIPINE ER OSMOTIC RELEASE 90 MG PO TB24
90.0000 mg | ORAL_TABLET | Freq: Every day | ORAL | Status: DC
  Administered 2012-10-15: 90 mg via ORAL
  Filled 2012-10-14 (×3): qty 1

## 2012-10-14 MED ORDER — ONDANSETRON HCL 4 MG/2ML IJ SOLN: 4.0000 mg | Freq: Four times a day (QID) | INTRAMUSCULAR | Status: DC | PRN

## 2012-10-14 MED ORDER — ONDANSETRON HCL 4 MG PO TABS: 4.0000 mg | ORAL_TABLET | Freq: Four times a day (QID) | ORAL | Status: DC | PRN

## 2012-10-14 MED ORDER — CARVEDILOL 25 MG PO TABS
25.0000 mg | ORAL_TABLET | Freq: Two times a day (BID) | ORAL | Status: DC
  Administered 2012-10-14 – 2012-10-15 (×2): 25 mg via ORAL
  Filled 2012-10-14 (×4): qty 1

## 2012-10-14 MED ORDER — ATORVASTATIN CALCIUM 40 MG PO TABS
40.0000 mg | ORAL_TABLET | Freq: Every day | ORAL | Status: DC
  Administered 2012-10-14 – 2012-10-15 (×2): 40 mg via ORAL
  Filled 2012-10-14 (×2): qty 1

## 2012-10-14 MED ORDER — ACETAMINOPHEN 325 MG PO TABS: 650.0000 mg | ORAL_TABLET | Freq: Four times a day (QID) | ORAL | Status: DC | PRN

## 2012-10-14 MED ORDER — IRBESARTAN 300 MG PO TABS
300.0000 mg | ORAL_TABLET | Freq: Every day | ORAL | Status: DC
  Administered 2012-10-14 – 2012-10-15 (×2): 300 mg via ORAL
  Filled 2012-10-14 (×2): qty 1

## 2012-10-14 MED ORDER — ISOSORBIDE DINITRATE ER 40 MG PO TBCR
40.0000 mg | EXTENDED_RELEASE_TABLET | Freq: Two times a day (BID) | ORAL | Status: DC
  Administered 2012-10-14 – 2012-10-15 (×2): 40 mg via ORAL
  Filled 2012-10-14 (×5): qty 1

## 2012-10-14 MED ORDER — MINOCYCLINE HCL 100 MG PO CAPS
100.0000 mg | ORAL_CAPSULE | Freq: Every day | ORAL | Status: DC
  Administered 2012-10-14 – 2012-10-15 (×2): 100 mg via ORAL
  Filled 2012-10-14 (×3): qty 1

## 2012-10-14 MED ORDER — FUROSEMIDE 80 MG PO TABS
80.0000 mg | ORAL_TABLET | Freq: Every day | ORAL | Status: DC
  Administered 2012-10-14 – 2012-10-15 (×2): 80 mg via ORAL
  Filled 2012-10-14 (×2): qty 1

## 2012-10-14 MED ORDER — SODIUM CHLORIDE 0.9 % IV SOLN: 250.0000 mL | INTRAVENOUS | Status: DC | PRN

## 2012-10-14 MED ORDER — POTASSIUM CHLORIDE CRYS ER 20 MEQ PO TBCR
20.0000 meq | EXTENDED_RELEASE_TABLET | Freq: Every day | ORAL | Status: DC
  Administered 2012-10-14 – 2012-10-15 (×2): 20 meq via ORAL
  Filled 2012-10-14 (×2): qty 1

## 2012-10-14 NOTE — ED Notes (Signed)
Patient transported to X-ray 
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
## 2012-10-14 NOTE — H&P (Signed)
[facility] History and Physical  [name]:[id number] DOB: 11-29-1945 DOA: 10/14/2012  Referring physician: er PCP: Darnelle Bos, MD  Specialists:   Chief Complaint: fall at home  HPI: [name] is a 67 y.o. male  Who was walking at home after taking his disabled brother to the bathroom.  He does not remember falling.  He did hit his head.   He states he has fallen 2 x in the last 2 months but has not told his PCP.  No bowel of bladder incontinence.  No headache.  Recent URI and has just completed his z pac No hearing loss, no CP, no SOB He has had orthostatic hypotension in the past and this is different that before In the ER a head CT done that did not show any bleeding.  He was getting a chest x ray in the ER and when he was raised up he experienced vertiginous like activity (nausea, dizziness)  Review of Systems: all systems reviewed, negative unless stated above    Past Medical History  Diagnosis Date  . Reflux   . Leg pain 03/17/2009    With walking  . Chest pain   . Hypertension   . Hyperlipidemia   . Myocardial infarction   . CHF (congestive heart failure)   . Diabetes mellitus Age 79  . Popliteal aneurysm     repaired left side Dr Madilyn Fireman 2008  . PAD (peripheral artery disease)     Right SFA stent  . Hx of CABG 1993  . Ischemic cardiomyopathy   . Afib   . Retinopathy   . PVD (peripheral vascular disease)   . Tubular adenoma   . Arthritis     in back  . Heart attack   . Weight loss, unintentional   . Trouble swallowing   . Change in voice   . Cough   . Wheezing   . Abdominal pain   . Weakness   . Generalized headaches   . Rash   . Bruises easily   . Diarrhea   . Leg swelling   . Hyperthyroidism    Past Surgical History  Procedure Laterality Date  . Pr vein bypass graft,aorto-fem-pop    . Back surgery  1994    L5 discectomy  . Coronary artery bypass graft      X2  . Cataract extraction, bilateral     Social  History:  reports that he quit smoking about 29 years ago. His smoking use included Cigarettes. He smoked 0.00 packs per day. He has never used smokeless tobacco. He reports that he does not drink alcohol or use illicit drugs.   Allergies  Allergen Reactions  . Erythromycin     Patient unsure of reaction, was told he was allergic.  Marland Kitchen Penicillins Rash    Happened in childhood.    Family History  Problem Relation Age of Onset  . Heart disease Other   . Cancer Father     stomach  . Cancer Mother     ovarian  . Cancer Maternal Aunt     breast    Prior to Admission medications   Medication Sig Start Date End Date Taking? Authorizing Provider  acetaminophen (TYLENOL) 500 MG tablet Take 1,000 mg by mouth 2 (two) times daily as needed for pain (pain/headache).   Yes Historical Provider, MD  atorvastatin (LIPITOR) 40 MG tablet Take 40 mg by mouth daily.     Yes Historical Provider, MD  carvedilol (COREG) 25 MG tablet  Take 25 mg by mouth 2 (two) times daily with a meal.     Yes Historical Provider, MD  clopidogrel (PLAVIX) 75 MG tablet Take 75 mg by mouth daily.     Yes Historical Provider, MD  furosemide (LASIX) 80 MG tablet Take 80 mg by mouth daily.   Yes Historical Provider, MD  isosorbide dinitrate (ISOCHRON) 40 MG CR tablet Take 40 mg by mouth every 12 (twelve) hours.   Yes Historical Provider, MD  metFORMIN (GLUCOPHAGE) 500 MG tablet Take 500 mg by mouth 2 (two) times daily with a meal.   Yes Historical Provider, MD  metroNIDAZOLE (METROGEL) 1 % gel Apply 1 application topically daily as needed (rosacia).    Yes Historical Provider, MD  minocycline (MINOCIN,DYNACIN) 100 MG capsule Take 100 mg by mouth daily.   Yes Historical Provider, MD  NIFEdipine (ADALAT CC) 90 MG 24 hr tablet Take 90 mg by mouth daily.   Yes Historical Provider, MD  nitroGLYCERIN (NITROSTAT) 0.4 MG SL tablet Place 0.4 mg under the tongue every 5 (five) minutes as needed.     Yes Historical Provider, MD  potassium  chloride SA (K-DUR,KLOR-CON) 20 MEQ tablet Take 20 mEq by mouth daily.   Yes Historical Provider, MD  telmisartan (MICARDIS) 80 MG tablet Take 80 mg by mouth daily.     Yes Historical Provider, MD  warfarin (COUMADIN) 5 MG tablet Take 7.5 mg by mouth daily.    Yes Historical Provider, MD   Physical Exam: Filed Vitals:   10/14/12 0930 10/14/12 1159 10/14/12 1230 10/14/12 1330  BP: 152/72 156/66 157/57 151/58  Pulse: 43 61 60 61  Temp:  97.6 F (36.4 C)    TempSrc:  Oral    Resp: 18 20 20 19   SpO2: 99% 100% 100% 99%     General:  A+Ox3, NAD  Eyes: wnl  ENT: wnl  Neck: supple  Cardiovascular: irregular  Respiratory: clear anterior  Abdomen: +BS, soft, NT  Skin: no rashes or lesions  Musculoskeletal: moves all 4 extremitties  Psychiatric: no SI/no HI  Neurologic: CN 2-12 intact  Labs on Admission:  Basic Metabolic Panel:  Recent Labs Lab 10/14/12 1155 10/14/12 1205  NA 140 141  K 3.7 3.7  CL 103 107  CO2 22  --   GLUCOSE 117* 120*  BUN 12 11  CREATININE 0.81 1.00  CALCIUM 9.9  --    Liver Function Tests:  Recent Labs Lab 10/14/12 1155  AST 22  ALT 20  ALKPHOS 94  BILITOT 0.7  PROT 7.6  ALBUMIN 4.2   No results found for this basename: LIPASE, AMYLASE,  in the last 168 hours No results found for this basename: AMMONIA,  in the last 168 hours CBC:  Recent Labs Lab 10/14/12 1155 10/14/12 1205  WBC 3.9*  --   NEUTROABS 2.4  --   HGB 14.0 13.6  HCT 39.4 40.0  MCV 85.1  --   PLT 115*  --    Cardiac Enzymes: No results found for this basename: CKTOTAL, CKMB, CKMBINDEX, TROPONINI,  in the last 168 hours  BNP (last 3 results)  Recent Labs  10/14/12 1155  PROBNP 1048.0*   CBG: No results found for this basename: GLUCAP,  in the last 168 hours  Radiological Exams on Admission: Dg Chest 2 View  10/14/2012  *RADIOLOGY REPORT*  Clinical Data: Larey Seat landing on left hip, left groin pain, chest tightness, shortness of breath, cough,  dizziness, history hypertension, coronary artery disease post MI and  CABG, ischemic cardiomyopathy, CHF, diabetes  CHEST - 2 VIEW  Comparison: 08/05/2011  Findings: Enlargement of cardiac silhouette post CABG. Atherosclerotic calcification aorta. Pulmonary vascular congestion. Mild chronic peribronchial thickening without acute failure or segmental consolidation. No pleural effusion or pneumothorax.  IMPRESSION: Enlargement of cardiac silhouette with pulmonary vascular congestion post CABG. Minimal chronic bronchitic changes. No acute abnormalities.   Original Report Authenticated By: Ulyses Southward, M.D.    Dg Hip Complete Left  10/14/2012  *RADIOLOGY REPORT*  Clinical Data: Left groin pain post fall landing on left hip  LEFT HIP - COMPLETE 2+ VIEW  Comparison: None  Findings: Scattered atherosclerotic calcifications. Osseous demineralization. Hip and SI joint spaces preserved. No acute fracture, dislocation, or bone destruction. Surgical clips at medial upper left thigh question prior vascular surgery or saphenous vein harvest. Degenerative disc and facet disease changes lumbar spine.  IMPRESSION: No acute bony abnormalities. Osseous demineralization.   Original Report Authenticated By: Ulyses Southward, M.D.    Ct Head Wo Contrast  10/14/2012  *RADIOLOGY REPORT*  Clinical Data: Larey Seat with trauma to the left side of the head.  CT HEAD WITHOUT CONTRAST  Technique:  Contiguous axial images were obtained from the base of the skull through the vertex without contrast.  Comparison: None.  Findings: The brain shows mild generalized atrophy.  There is no evidence of old or acute focal infarction, mass lesion, hemorrhage, hydrocephalus or extra-axial collection.  There is advanced atherosclerotic change of the major vessels at the base of the brain.  No skull fracture.  No traumatic fluid in the sinuses, middle ears or mastoids.  IMPRESSION: No acute or traumatic finding.  Mild generalized atrophy.  Advanced atherosclerotic  calcification of the major vessels at the base of the brain.   Original Report Authenticated By: Paulina Fusi, M.D.       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 syncope-  Will place on tele, CE, echo.  Sounds very much like vertigo- ask PT/OT to see him for this in the AM 2. HTN- continue medications- he is on medications that slow his rate so watch for bradycardia 3. H/o orthostatic hypotension- tilt test 4. CAD- cycle CE, tele  Dr. Newell Coral VM messge left for pick up  In the AM  Reported history of CHF- check echo  Code Status: full Family Communication: none Disposition Plan:   Time spent: 70 min  [place] Agent [facility] Pager [phone]  If 7PM-7AM, please contact night-coverage [email or web address] Password [facility] 10/14/2012, 2:21 PM

## 2012-10-14 NOTE — ED Notes (Signed)
Pt from home, c/o dizziness resulting in fall on right hip.

## 2012-10-15 DIAGNOSIS — I2510 Atherosclerotic heart disease of native coronary artery without angina pectoris: Secondary | ICD-10-CM | POA: Diagnosis present

## 2012-10-15 DIAGNOSIS — E1139 Type 2 diabetes mellitus with other diabetic ophthalmic complication: Secondary | ICD-10-CM | POA: Diagnosis present

## 2012-10-15 DIAGNOSIS — E113299 Type 2 diabetes mellitus with mild nonproliferative diabetic retinopathy without macular edema, unspecified eye: Secondary | ICD-10-CM | POA: Diagnosis present

## 2012-10-15 DIAGNOSIS — H8110 Benign paroxysmal vertigo, unspecified ear: Secondary | ICD-10-CM | POA: Diagnosis present

## 2012-10-15 DIAGNOSIS — E1151 Type 2 diabetes mellitus with diabetic peripheral angiopathy without gangrene: Secondary | ICD-10-CM | POA: Diagnosis present

## 2012-10-15 DIAGNOSIS — I70219 Atherosclerosis of native arteries of extremities with intermittent claudication, unspecified extremity: Secondary | ICD-10-CM | POA: Diagnosis present

## 2012-10-15 LAB — GLUCOSE, CAPILLARY: Glucose-Capillary: 101 mg/dL — ABNORMAL HIGH (ref 70–99)

## 2012-10-15 NOTE — Evaluation (Signed)
Physical Therapy Evaluation Patient Details Name: [name] MRN: [id number] DOB: [birth year] Today's Date: 10/15/2012 Time: 4098-1191 PT Time Calculation (min): 28 min  PT Assessment / Plan / Recommendation Clinical Impression  Patient is a 67 y.o. male admitted s/p fall and recent falls with hitting head on floor.  Denies any vertiginous symptoms, just has h/o lightheadedness with strict BP control and is accustomed to taking extra time when rising.  Feel no current skilled PT need, however patient educated in signs and symptoms of vertigo as well as treatment and specific balance therapy available on outpatient basis.  No further PT needs at this time.      PT Assessment  Patent does not need any further PT services    Follow Up Recommendations  No PT follow up          Equipment Recommendations  None recommended by PT          Precautions / Restrictions Precautions Precautions: Fall Precaution Comments: three recent falls (in 2-3 months) this one assisting brother to bathroom (not sure what happened,) one time bending to sit on edge of tub in bathroom after fixing something and missed tub edge hit head on tub; another trying to carry furniture and tripped.   Pertinent Vitals/Pain Denies pain      Mobility  Bed Mobility Bed Mobility: Sit to Sidelying Right;Sit to Sidelying Left;Left Sidelying to Sit;Right Sidelying to Sit Right Sidelying to Sit: 6: Modified independent (Device/Increase time) Left Sidelying to Sit: 6: Modified independent (Device/Increase time) Sit to Sidelying Right: 6: Modified independent (Device/Increase time) Sit to Sidelying Left: 6: Modified independent (Device/Increase time) Transfers Transfers: Sit to Stand;Stand to Sit Sit to Stand: 7: Independent Stand to Sit: 7: Independent Ambulation/Gait Ambulation/Gait Assistance: 7: Independent Ambulation Distance (Feet): 200 Feet Assistive device: None Gait Pattern: Within Functional Limits     Vestibular   Vestibular subjective: denies room spinning dizziness, but has had recent URI with z-pak, feels no current head or ear pressure, earing equal and intact bial to scratch test, reports lightheaded when first rising with h/o managed BP and used to sitting several seconds prior to rising. Oculomotor assessment: smooth pursuits and saccades WNL without any nystagmus noted, vestibular ocular reflex intact with horizontal and vertical movement with no c/o dizziness Positional: modified hallpike dix negative for nystagmus or dizziness in position with right and left side testing,  noted up from left position worse lightheadedness than up from right. Educated patient in fall prevention techniques including footwear, lighting, clear pathway, tacking down throw rugs, nonskid surface in shower.  Pt. and wife verbalized understanding, Educated patient in symptoms of vertigo and treatment and gave informational handouts on balance and vestibular programs at [facility] as well as referral form.     PT Goals    Visit Information  Last PT Received On: 10/15/12    Subjective Data  Subjective: I think they got it mixed up.  I don't pass out, just have fallen (tripped) couple of times and hit my head.  Usually trying to carry furniture or fixing something in bathroom. 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  Communication: No difficulties    Cognition  Cognition Overall Cognitive Status: Appears within functional limits for tasks  assessed/performed Arousal/Alertness: Awake/alert Orientation Level: Appears intact for tasks assessed Behavior During Session: [facility] for tasks performed    Extremity/Trunk Assessment Right Upper Extremity Assessment RUE Coordination: WFL - gross/fine motor Left Upper Extremity Assessment LUE Coordination: WFL - gross/fine motor Right Lower Extremity Assessment RLE ROM/Strength/Tone: Within functional levels RLE Sensation: WFL - Light Touch RLE Coordination: WFL - gross/fine motor Left Lower Extremity Assessment LLE ROM/Strength/Tone: Within functional levels LLE Sensation: WFL - Light Touch LLE Coordination: WFL - gross/fine motor Trunk Assessment Trunk Assessment: Kyphotic (severe upper back kyphosis with forward head)   Balance Balance Balance Assessed: Yes Dynamic Standing Balance Dynamic Standing - Balance Support: No upper extremity supported Dynamic Standing - Level of Assistance: 7: Independent Dynamic Standing - Comments: eyes closed 10 s, balance feet together 1 minute, bending to pick item up from floor independent  End of Session PT - End of Session Equipment Utilized During Treatment: Gait belt Activity Tolerance: Patient tolerated treatment well Patient left: in bed;with family/visitor present Nurse Communication: Mobility status  GP Functional Assessment Tool Used: Clinical Observation Functional Limitation: Mobility: Walking and moving around Mobility: Walking and Moving Around Current Status (Z6109): 0 percent impaired, limited or restricted Mobility: Walking and Moving Around Goal Status (U0454): 0 percent impaired, limited or restricted Mobility: Walking and Moving Around Discharge Status [phone]): 0 percent impaired, limited or restricted   [facility] 10/15/2012, 10:36 AM Sheran Lawless, PT [phone] 10/15/2012

## 2012-10-15 NOTE — ED Provider Notes (Signed)
Medical screening examination/treatment/procedure(s) were conducted as a shared visit with non-physician practitioner(s) and myself.  I personally evaluated the patient during the encounter  I called for admission for this patient.  He is high risk given reported h/o CHF with syncope.  Pt agreeable with plan  Joya Gaskins, MD 10/15/12 [phone]

## 2012-10-15 NOTE — Progress Notes (Signed)
Utilization review completed.  

## 2012-10-15 NOTE — Discharge Summary (Signed)
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 Fell at home helping brother to [facility]. He does not remember fall but does remember hitting the floor. He had some shortlived positional vertigo that occurred after the fall.   After admission, no further lightheadedness and very little vertigo. He worked with PT and he had no further problems. MI was ruled out. There was no ectopy. He noted that he had a 30 day event recorder a few months ago. We will consider getting an echo in the office.    Consults: none.   Disposition: home  Discharge Orders   Future Appointments Provider Department Dept Phone   05/13/2013 3:00 PM [facility] [phone]   05/13/2013 3:30 PM [facility] [phone]   05/13/2013 4:00 PM Evern Bio, NP [facility] [phone]   Future Orders Complete By Expires     Diet - low sodium heart healthy  As directed     Increase activity slowly  As directed         Medication List    TAKE these medications       acetaminophen 500 MG tablet  Commonly known as:  TYLENOL  Take 1,000 mg by mouth 2 (two) times daily as needed for pain (pain/headache).     atorvastatin 40 MG tablet  Commonly known as:  LIPITOR  Take 40 mg by mouth daily.     carvedilol 25 MG tablet  Commonly known as:  COREG  Take 25 mg by mouth 2 (two) times daily with a meal.     clopidogrel 75 MG tablet  Commonly known as:  PLAVIX  Take 75 mg by mouth daily.     furosemide 80 MG tablet  Commonly known as:  LASIX  Take 80 mg by mouth daily.     isosorbide dinitrate 40 MG CR tablet  Commonly known as:  ISOCHRON  Take 40 mg by mouth every 12 (twelve) hours.     metFORMIN 500 MG tablet  Commonly known as:  GLUCOPHAGE  Take 500 mg by mouth 2 (two) times daily with a meal.     metroNIDAZOLE 1 % gel  Commonly known as:  METROGEL  Apply 1 application topically daily as needed (rosacia).     minocycline 100 MG capsule  Commonly known as:  MINOCIN,DYNACIN  Take 100 mg by mouth daily.     NIFEdipine 90 MG 24 hr tablet  Commonly known as:  ADALAT CC  Take 90 mg by mouth daily.     nitroGLYCERIN 0.4 MG SL tablet  Commonly known as:  NITROSTAT  Place 0.4 mg under the tongue every 5 (five) minutes as needed.     potassium chloride SA 20 MEQ tablet  Commonly known as:  K-DUR,KLOR-CON  Take 20 mEq by mouth daily.     telmisartan 80 MG tablet  Commonly known as:  MICARDIS  Take 80  mg by mouth daily.     warfarin 5 MG tablet  Commonly known as:  COUMADIN  Take 7.5 mg by mouth daily.         Things to follow up on after discharge: none.   Time coordinating discharge: 25 minutes including medication reconciliation,  preparation of discharge papers, and discussion with patient and family.   SignedDarnelle Bos 10/15/2012, 12:07 PM

## 2013-05-12 ENCOUNTER — Encounter: Payer: Self-pay | Admitting: Family

## 2013-05-13 ENCOUNTER — Ambulatory Visit (INDEPENDENT_AMBULATORY_CARE_PROVIDER_SITE_OTHER): Payer: 59 | Admitting: Family

## 2013-05-13 ENCOUNTER — Encounter (INDEPENDENT_AMBULATORY_CARE_PROVIDER_SITE_OTHER): Payer: 59 | Admitting: *Deleted

## 2013-05-13 ENCOUNTER — Encounter: Payer: Self-pay | Admitting: Family

## 2013-05-13 VITALS — BP 126/64 | HR 59 | Resp 16 | Ht 70.0 in | Wt 230.0 lb

## 2013-05-13 DIAGNOSIS — I739 Peripheral vascular disease, unspecified: Secondary | ICD-10-CM

## 2013-05-13 DIAGNOSIS — Z48812 Encounter for surgical aftercare following surgery on the circulatory system: Secondary | ICD-10-CM

## 2013-05-13 NOTE — Progress Notes (Signed)
[facility] [place] HISTORY AND PHYSICAL -PAD  History of Present Illness [name] is a 67 y.o. 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 This bypass was done in 2008 by Dr. Madilyn Fireman. This was done with a vein graft. He previously had vein harvested and left radial artery harvested for coronary bypass.  Of note he has had a previous right superficial femoral artery stent by Dr. Eldridge Dace also in 2008. Patient also has a history of atrial fibrillation and takes coumadin.  PMHX is significant for one MI, 2 CABG's, and 1 cardiac stent placement.  He rarely has some calf cramping in one leg after walking up an incline, he does not remember which leg. Pt. denies rest pain; denies night pain denies non healing ulcers on lower extremities.  Patient denies New Medical or Surgical History   Pt Diabetic: Yes, last A1C was 5.7%, 3 months ago Pt smoker: former smoker, quit 1985  Pt meds include: Statin :Yes Betablocker: Yes ASA: No Other anticoagulants/antiplatelets: Plavix, coumadin  Past Medical History  Diagnosis Date  . Reflux   . Leg pain 03/17/2009    With walking  . Chest pain   . Hypertension   . Hyperlipidemia   . Myocardial infarction   . CHF (congestive heart failure)   . Diabetes mellitus Age 54  . Popliteal aneurysm     repaired left side Dr Madilyn Fireman 2008  . PAD (peripheral artery disease)     Right SFA stent  . Hx of CABG 1993  . Ischemic cardiomyopathy   . Afib   . Retinopathy   . PVD (peripheral vascular disease)   . Tubular adenoma   . Arthritis     in back  . Heart attack   . Weight loss, unintentional   . Trouble swallowing   . Change in voice   . Cough   . Wheezing   . Abdominal pain   . Weakness   . Generalized headaches   . Rash   . Bruises easily   . Diarrhea   . Leg swelling   . Hyperthyroidism   . Coronary  artery disease     Social History History  Substance Use Topics  . Smoking status: Former Smoker    Types: Cigarettes    Quit date: 09/03/1983  . Smokeless tobacco: Never Used  . Alcohol Use: No    Family History Family History  Problem Relation Age of Onset  . Heart disease Other   . Cancer Father     stomach  . Diabetes Father   . Hypertension Father   . Cancer Mother     ovarian  . Diabetes Mother   . Heart disease Mother     Heart Disease before age 76  . Hyperlipidemia Mother   . Hypertension Mother   . Cancer Maternal Aunt     breast  . Cancer Brother     Lyphoma  . Hyperlipidemia Brother   . Deep vein thrombosis Brother   . Diabetes Daughter     Amputation  . Heart disease Daughter     Heart Disease before age 29  . Hyperlipidemia Daughter   . Hypertension Daughter   . Peripheral vascular disease Daughter   . Hypertension Brother     Past Surgical History  Procedure Laterality Date  . Back surgery  1994    L5 discectomy  .  Cataract extraction, bilateral    . Coronary artery bypass graft  1993-2001    X2  . Pr vein bypass graft,aorto-fem-pop  2008    Allergies  Allergen Reactions  . Amiodarone Other (See Comments)    Thyroid disfunction  . Erythromycin     Patient unsure of reaction, was told he was allergic.  Marland Kitchen Penicillins Rash    Happened in childhood.    Current Outpatient Prescriptions  Medication Sig Dispense Refill  . acetaminophen (TYLENOL) 500 MG tablet Take 1,000 mg by mouth 2 (two) times daily as needed for pain (pain/headache).      Marland Kitchen atorvastatin (LIPITOR) 40 MG tablet Take 40 mg by mouth daily.        . carvedilol (COREG) 25 MG tablet Take 25 mg by mouth 2 (two) times daily with a meal.        . clopidogrel (PLAVIX) 75 MG tablet Take 75 mg by mouth daily.        . furosemide (LASIX) 80 MG tablet Take 80 mg by mouth daily.      . isosorbide dinitrate (ISOCHRON) 40 MG CR tablet Take 40 mg by mouth every 12 (twelve) hours.      .  metFORMIN (GLUCOPHAGE) 500 MG tablet Take 500 mg by mouth 2 (two) times daily with a meal.      . metroNIDAZOLE (METROGEL) 1 % gel Apply 1 application topically daily as needed (rosacia).       Marland Kitchen NIFEdipine (ADALAT CC) 90 MG 24 hr tablet Take 90 mg by mouth daily.      . nitroGLYCERIN (NITROSTAT) 0.4 MG SL tablet Place 0.4 mg under the tongue every 5 (five) minutes as needed.        . potassium chloride SA (K-DUR,KLOR-CON) 20 MEQ tablet Take 20 mEq by mouth daily.      Marland Kitchen telmisartan (MICARDIS) 80 MG tablet Take 80 mg by mouth daily.        Marland Kitchen warfarin (COUMADIN) 5 MG tablet Take 7.5 mg by mouth daily.       . minocycline (MINOCIN,DYNACIN) 100 MG capsule Take 100 mg by mouth daily.       No current facility-administered medications for this visit.    ROS: [x]  Positive   [ ]  Denies  General:[ ]  Weight loss,  [ ]  Weight gain, [ ]  Fever, [ ]  chills Neurologic: [ ]  Dizziness, [ ]  Blackouts, [ ]  Seizure [ ]  Stroke, [ ]  "Mini stroke", [ ]  Slurred speech, [ ]  Temporary blindness;  [ ] weakness, [ ]  Hoarseness Cardiac: [ ]  Chest pain/pressure, [ ]  Shortness of breath at rest [ ]  Shortness of breath with exertion,  Arly.Keller ]  Atrial fibrillation or irregular heartbeat Vascular:[ ]  Pain in legs with walking, [ ]  Pain in legs at rest ,[ ]  Pain in legs at night,  [ ]   Non-healing ulcer, [ ]  Blood clot in vein/DVT,   Pulmonary: [ ]  Home oxygen, [ ]   Productive cough, [ ]  Coughing up blood,  [ ]  Asthma,  [ ]  Wheezing Musculoskeletal:  X[ ]  Arthritis, Arly.Keller ] Low back pain,  [ ]  Joint pain Hematologic:[X ] Easy Bruising, [ ]  Anemia; [ ]  Hepatitis Gastrointestinal: [ ]  Blood in stool,  [ ]  Gastroesophageal Reflux, [ ]  Trouble swallowing Urinary: [ ]  chronic Kidney disease, [ ]  on HD, [ ]  Burning with urination, [ ]  Frequent urination, [ ]  Difficulty urinating;  Skin: [ ]  Rashes, [ ]  Wounds     Physical Examination  Filed Vitals:   05/13/13 1604  BP: 126/64  Pulse: 59  Resp: 16   Filed Weights   05/13/13  1604  Weight: 230 lb (104.327 kg)   Body mass index is 33 kg/(m^2).   General: A&O x 3, WDWN, obese. Gait: normal Eyes: PERRLA, Pulmonary: CTAB, without wheezes , rales or rhonchi Cardiac: regular Rythm , without murmur          Carotid Bruits Left Right   Negative Negative                            Radial pulses: left is not palpable, right is 2+ palpable. Left ulnar pulse is palpable at 2+. VASCULAR EXAM: Extremities without ischemic changes  without Gangrene ; without open wounds.                                                                                                          LE Pulses LEFT RIGHT       FEMORAL  palpable  not palpable        POPLITEAL   palpable   not palpable       POSTERIOR TIBIAL   palpable   not palpable        DORSALIS PEDIS      ANTERIOR TIBIAL  palpable   palpable    Abdomen: soft, NT, no masses Skin: no rashes, ulcers noted Musculoskeletal: no muscle wasting or atrophy  Neurologic: A&O X 3; Appropriate Affect ; SENSATION: normal; MOTOR FUNCTION:  moving all extremities equally, motor strength is 5/5 throughout. Speech is fluent/normal. CN 2-12 intact.  Non-Invasive Vascular Imaging: DATE: 05/13/2013 Bilateral ankle pressures not performed due to known non-compressible tibial vessels. TBI: RIGHT 0.45, Waveforms: post tibial: biphasic, anterior tibial: monophasic;  LEFT 0.71, Waveforms: triphasic DUPLEX SCAN OF BYPASS: no hemodynamically significant stenosis of the left leg bypass graft.  Previous angiogram: Yes, in 2007, none since then.  ASSESSMENT: [name] is a 67 y.o. male who presents for follow up of left  proximal superficial femoral to below knee popliteal artery bypass graft done for a left popliteal aneurysm and right superficial femoral artery stent, the left LE graft demonstrates no evidence of restenosis on Duplex.  He complains of claudication in one leg after walking on an incline for some time, of which side  he cannot remember.  PLAN:  I discussed in depth with the patient the nature of atherosclerosis, and emphasized the importance of maximal medical management including strict control of blood pressure, blood glucose, and lipid levels, obtaining regular exercise, and continued cessation of smoking.  The patient is aware that without maximal medical management the underlying atherosclerotic disease process will progress, limiting the benefit of any interventions. Based on the patient's vascular studies and examination, pt will return to clinic in 1 year for bilateral ABI's and bilateral LE Duplex. The patient was given information about PAD including signs, symptoms, treatment, what symptoms should prompt the patient to seek immediate medical care, and risk reduction measures to  take. He knows to call sooner should he have problems or questions.  Charisse March, RN, MSN, FNP-C Office Phone: [phone]  Clinic MD: Darrick Penna  05/13/2013 4:18 PM

## 2013-05-13 NOTE — Patient Instructions (Addendum)
Peripheral Vascular Disease Peripheral Vascular Disease (PVD), also called Peripheral Arterial Disease (PAD), is a circulation problem caused by cholesterol (atherosclerotic plaque) deposits in the arteries. PVD commonly occurs in the lower extremities (legs) but it can occur in other areas of the body, such as your arms. The cholesterol buildup in the arteries reduces blood flow which can cause pain and other serious problems. The presence of PVD can place a person at risk for Coronary Artery Disease (CAD).  CAUSES  Causes of PVD can be many. It is usually associated with more than one risk factor such as:   High Cholesterol.  Smoking.  Diabetes.  Lack of exercise or inactivity.  High blood pressure (hypertension).  Obesity.  Family history. SYMPTOMS   When the lower extremities are affected, patients with PVD may experience:  Leg pain with exertion or physical activity. This is called INTERMITTENT CLAUDICATION. This may present as cramping or numbness with physical activity. The location of the pain is associated with the level of blockage. For example, blockage at the abdominal level (distal abdominal aorta) may result in buttock or hip pain. Lower leg arterial blockage may result in calf pain.  As PVD becomes more severe, pain can develop with less physical activity.  In people with severe PVD, leg pain may occur at rest.  Other PVD signs and symptoms:  Leg numbness or weakness.  Coldness in the affected leg or foot, especially when compared to the other leg.  A change in leg color.  Patients with significant PVD are more prone to ulcers or sores on toes, feet or legs. These may take longer to heal or may reoccur. The ulcers or sores can become infected.  If signs and symptoms of PVD are ignored, gangrene may occur. This can result in the loss of toes or loss of an entire limb.  Not all leg pain is related to PVD. Other medical conditions can cause leg pain such  as:  Blood clots (embolism) or Deep Vein Thrombosis.  Inflammation of the blood vessels (vasculitis).  Spinal stenosis. DIAGNOSIS  Diagnosis of PVD can involve several different types of tests. These can include:  Pulse Volume Recording Method (PVR). This test is simple, painless and does not involve the use of X-rays. PVR involves measuring and comparing the blood pressure in the arms and legs. An ABI (Ankle-Brachial Index) is calculated. The normal ratio of blood pressures is 1. As this number becomes smaller, it indicates more severe disease.  < 0.95  indicates significant narrowing in one or more leg vessels.  <0.8 there will usually be pain in the foot, leg or buttock with exercise.  <0.4 will usually have pain in the legs at rest.  <0.25  usually indicates limb threatening PVD.  Doppler detection of pulses in the legs. This test is painless and checks to see if you have a pulses in your legs/feet.  A dye or contrast material (a substance that highlights the blood vessels so they show up on x-ray) may be given to help your caregiver better see the arteries for the following tests. The dye is eliminated from your body by the kidney's. Your caregiver may order blood work to check your kidney function and other laboratory values before the following tests are performed:  Magnetic Resonance Angiography (MRA). An MRA is a picture study of the blood vessels and arteries. The MRA machine uses a large magnet to produce images of the blood vessels.  Computed Tomography Angiography (CTA). A CTA is a   specialized x-ray that looks at how the blood flows in your blood vessels. An IV may be inserted into your arm so contrast dye can be injected.  Angiogram. Is a procedure that uses x-rays to look at your blood vessels. This procedure is minimally invasive, meaning a small incision (cut) is made in your groin. A small tube (catheter) is then inserted into the artery of your groin. The catheter is  guided to the blood vessel or artery your caregiver wants to examine. Contrast dye is injected into the catheter. X-rays are then taken of the blood vessel or artery. After the images are obtained, the catheter is taken out. TREATMENT  Treatment of PVD involves many interventions which may include:  Lifestyle changes:  Quitting smoking.  Exercise.  Following a low fat, low cholesterol diet.  Control of diabetes.  Foot care is very important to the PVD patient. Good foot care can help prevent infection.  Medication:  Cholesterol-lowering medicine.  Blood pressure medicine.  Anti-platelet drugs.  Certain medicines may reduce symptoms of Intermittent Claudication.  Interventional/Surgical options:  Angioplasty. An Angioplasty is a procedure that inflates a balloon in the blocked artery. This opens the blocked artery to improve blood flow.  Stent Implant. A wire mesh tube (stent) is placed in the artery. The stent expands and stays in place, allowing the artery to remain open.  Peripheral Bypass Surgery. This is a surgical procedure that reroutes the blood around a blocked artery to help improve blood flow. This type of procedure may be performed if Angioplasty or stent implants are not an option. SEEK IMMEDIATE MEDICAL CARE IF:   You develop pain or numbness in your arms or legs.  Your arm or leg turns cold, becomes blue in color.  You develop redness, warmth, swelling and pain in your arms or legs. MAKE SURE YOU:   Understand these instructions.  Will watch your condition.  Will get help right away if you are not doing well or get worse. Document Released: 09/26/2004 Document Revised: 11/11/2011 Document Reviewed: 08/23/2008 Exit[employer] Patient Information 2014 [employer].  

## 2013-05-25 ENCOUNTER — Other Ambulatory Visit (HOSPITAL_COMMUNITY): Payer: Self-pay | Admitting: Interventional Cardiology

## 2013-05-25 DIAGNOSIS — I5022 Chronic systolic (congestive) heart failure: Secondary | ICD-10-CM

## 2013-05-27 ENCOUNTER — Ambulatory Visit (HOSPITAL_COMMUNITY)
Admission: RE | Admit: 2013-05-27 | Discharge: 2013-05-27 | Disposition: A | Discharge status: Home / Self Care | Payer: 59 | Source: Ambulatory Visit | Attending: Interventional Cardiology | Admitting: Interventional Cardiology

## 2013-05-27 DIAGNOSIS — I079 Rheumatic tricuspid valve disease, unspecified: Secondary | ICD-10-CM | POA: Insufficient documentation

## 2013-05-27 DIAGNOSIS — I509 Heart failure, unspecified: Secondary | ICD-10-CM | POA: Insufficient documentation

## 2013-05-27 DIAGNOSIS — E119 Type 2 diabetes mellitus without complications: Secondary | ICD-10-CM | POA: Insufficient documentation

## 2013-05-27 DIAGNOSIS — I10 Essential (primary) hypertension: Secondary | ICD-10-CM | POA: Insufficient documentation

## 2013-05-27 DIAGNOSIS — I5022 Chronic systolic (congestive) heart failure: Secondary | ICD-10-CM

## 2013-05-27 DIAGNOSIS — I359 Nonrheumatic aortic valve disorder, unspecified: Secondary | ICD-10-CM | POA: Insufficient documentation

## 2013-05-27 DIAGNOSIS — I059 Rheumatic mitral valve disease, unspecified: Secondary | ICD-10-CM | POA: Insufficient documentation

## 2013-05-27 DIAGNOSIS — I379 Nonrheumatic pulmonary valve disorder, unspecified: Secondary | ICD-10-CM | POA: Insufficient documentation

## 2013-06-14 ENCOUNTER — Other Ambulatory Visit (HOSPITAL_COMMUNITY): Payer: Self-pay | Admitting: Interventional Cardiology

## 2013-10-28 ENCOUNTER — Other Ambulatory Visit: Payer: Self-pay | Admitting: Family

## 2013-10-28 DIAGNOSIS — Z48812 Encounter for surgical aftercare following surgery on the circulatory system: Secondary | ICD-10-CM

## 2013-10-28 DIAGNOSIS — I739 Peripheral vascular disease, unspecified: Secondary | ICD-10-CM

## 2013-10-28 DIAGNOSIS — I724 Aneurysm of artery of lower extremity: Secondary | ICD-10-CM

## 2013-12-08 ENCOUNTER — Ambulatory Visit
Admission: RE | Admit: 2013-12-08 | Discharge: 2013-12-08 | Disposition: A | Discharge status: Home / Self Care | Payer: Medicare Other | Source: Ambulatory Visit | Attending: Internal Medicine | Admitting: Internal Medicine

## 2013-12-08 ENCOUNTER — Other Ambulatory Visit: Payer: Self-pay | Admitting: Internal Medicine

## 2013-12-08 DIAGNOSIS — R079 Chest pain, unspecified: Secondary | ICD-10-CM

## 2013-12-14 ENCOUNTER — Ambulatory Visit
Admission: RE | Admit: 2013-12-14 | Discharge: 2013-12-14 | Disposition: A | Discharge status: Home / Self Care | Payer: Medicare Other | Source: Ambulatory Visit | Attending: Internal Medicine | Admitting: Internal Medicine

## 2013-12-14 ENCOUNTER — Other Ambulatory Visit: Payer: Self-pay | Admitting: Internal Medicine

## 2013-12-14 DIAGNOSIS — E049 Nontoxic goiter, unspecified: Secondary | ICD-10-CM

## 2014-02-07 ENCOUNTER — Ambulatory Visit: Payer: Medicare Other

## 2014-05-04 ENCOUNTER — Other Ambulatory Visit: Payer: Self-pay | Admitting: Internal Medicine

## 2014-05-04 DIAGNOSIS — Z136 Encounter for screening for cardiovascular disorders: Secondary | ICD-10-CM

## 2014-05-12 ENCOUNTER — Ambulatory Visit
Admission: RE | Admit: 2014-05-12 | Discharge: 2014-05-12 | Disposition: A | Discharge status: Home / Self Care | Payer: Medicare Other | Source: Ambulatory Visit | Attending: Internal Medicine | Admitting: Internal Medicine

## 2014-05-12 DIAGNOSIS — Z136 Encounter for screening for cardiovascular disorders: Secondary | ICD-10-CM

## 2014-05-20 ENCOUNTER — Encounter: Payer: Self-pay | Admitting: Family

## 2014-05-23 ENCOUNTER — Other Ambulatory Visit (HOSPITAL_COMMUNITY): Payer: 59

## 2014-05-23 ENCOUNTER — Ambulatory Visit: Payer: 59 | Admitting: Family

## 2014-05-23 ENCOUNTER — Telehealth: Payer: Self-pay | Admitting: Vascular Surgery

## 2014-05-23 ENCOUNTER — Encounter (HOSPITAL_COMMUNITY): Payer: 59

## 2014-05-23 NOTE — Telephone Encounter (Addendum)
Message copied by Gena Fray on Mon May 23, 2014  2:10 PM ------      Message from: [place], [place] K      Created: Wed May 18, 2014  4:18 PM      Regarding: RE: ? regarding appt interval change       I talked to him, his dopplers were lower at last OV with Vinnie Level. He understands and wants to see CEF, which I told him was OK.       ----- Message -----         From: Gena Fray         Sent: 05/18/2014   2:40 PM           To: [facility]      Subject: ? regarding appt interval change                         Please call Mr Witt at home # listed. He wants to know why he has been put on a 1 year rotation instead of 2 year rotation. He saw Vinnie Level at his last visit and wants to know why she changed Dr Oneida Alar plan of every 2 years.            He would like to speak with a nurse.             Thank you,      Hinton Dyer       ------  05/23/14: pt actually canceled his appt on 09/18- said he wcbtrs

## 2014-07-15 ENCOUNTER — Ambulatory Visit (INDEPENDENT_AMBULATORY_CARE_PROVIDER_SITE_OTHER): Payer: Medicare Other | Admitting: Interventional Cardiology

## 2014-07-15 ENCOUNTER — Encounter: Payer: Self-pay | Admitting: Interventional Cardiology

## 2014-07-15 VITALS — BP 130/70 | HR 51 | Ht 70.0 in | Wt 237.0 lb

## 2014-07-15 DIAGNOSIS — I739 Peripheral vascular disease, unspecified: Secondary | ICD-10-CM

## 2014-07-15 DIAGNOSIS — I10 Essential (primary) hypertension: Secondary | ICD-10-CM

## 2014-07-15 DIAGNOSIS — I5022 Chronic systolic (congestive) heart failure: Secondary | ICD-10-CM

## 2014-07-15 DIAGNOSIS — I25810 Atherosclerosis of coronary artery bypass graft(s) without angina pectoris: Secondary | ICD-10-CM

## 2014-07-15 DIAGNOSIS — I480 Paroxysmal atrial fibrillation: Secondary | ICD-10-CM

## 2014-07-15 DIAGNOSIS — I70219 Atherosclerosis of native arteries of extremities with intermittent claudication, unspecified extremity: Secondary | ICD-10-CM

## 2014-07-15 DIAGNOSIS — Z7901 Long term (current) use of anticoagulants: Secondary | ICD-10-CM

## 2014-07-15 DIAGNOSIS — E1151 Type 2 diabetes mellitus with diabetic peripheral angiopathy without gangrene: Secondary | ICD-10-CM

## 2014-07-15 NOTE — Patient Instructions (Signed)
Your physician recommends that you continue on your current medications as directed. Please refer to the Current Medication list given to you today.  Your physician wants you to follow-up in: 1 year. You will receive a reminder letter in the mail two months in advance. If you don't receive a letter, please call our office to schedule the follow-up appointment.  

## 2014-07-15 NOTE — Progress Notes (Signed)
Patient ID: [name], male   DOB: 1945/11/14, 69 y.o.   MRN: [id number]    [address], [address] [place], [place]  [address] Phone: [phone] Fax:  [phone]  Date:  07/15/2014   ID:  [name], DOB [birth year], MRN [id number]  PCP:  Horton Finer, MD   ASSESSMENT:  1. Chronic systolic Heart failure 2. Essential hypertension 3. PAD with claudication 4. CAD with prior bypass and stable angina. 5. Paroxysmal atrial fibrillation  PLAN:  1. Continue aerobic activity 2. Call if chest discomfort and dyspnea 3. Call if blood in stool or urine 4.clinical follow-up in one year.No change in medical regimen is recommended   SUBJECTIVE: [name] is a 68 y.o. male who is doing well. He's had no, dictation's on anticoagulation therapy which is managed by his primary care physician. He has a history of paroxysmal atrial fibrillation and had allergy to amiodarone. Off amiodarone, he has maintained normal sinus rhythm for the most part. He has history of coronary artery bypass grafting and has stable angina pectoris. His LV function is slightly decreased with an EF of 40% a year ago. He has class III limitations from a functional standpoint.  He denies change in anginal pattern, orthopnea, PND, ankle swelling, syncope, prolonged palpitations, and transient neurological symptoms. No medication side effects have been noted.   Wt Readings from Last 3 Encounters:  07/15/14 237 lb (107.502 kg)  05/13/13 230 lb (104.327 kg)  10/15/12 219 lb 12.8 oz (99.7 kg)     Past Medical History  Diagnosis Date  . Reflux   . Leg pain 03/17/2009    With walking  . Chest pain   . Hypertension   . Hyperlipidemia   . Myocardial infarction   . CHF (congestive heart failure)   . Diabetes mellitus Age 32  . Popliteal aneurysm     repaired left side Dr Amedeo Plenty 2008  . PAD (peripheral artery disease)     Right SFA stent  . Hx of CABG 1993  . Ischemic cardiomyopathy     . Afib   . Retinopathy   . PVD (peripheral vascular disease)   . Tubular adenoma   . Arthritis     in back  . Heart attack   . Weight loss, unintentional   . Trouble swallowing   . Change in voice   . Cough   . Wheezing   . Abdominal pain   . Weakness   . Generalized headaches   . Rash   . Bruises easily   . Diarrhea   . Leg swelling   . Hyperthyroidism   . Coronary artery disease     Current Outpatient Prescriptions  Medication Sig Dispense Refill  . acetaminophen (TYLENOL) 500 MG tablet Take 1,000 mg by mouth 2 (two) times daily as needed for pain (pain/headache).    Marland Kitchen atorvastatin (LIPITOR) 40 MG tablet Take 40 mg by mouth daily.      . carvedilol (COREG) 25 MG tablet Take 25 mg by mouth 2 (two) times daily with a meal.      . clopidogrel (PLAVIX) 75 MG tablet Take 75 mg by mouth daily.      . furosemide (LASIX) 80 MG tablet Take 80 mg by mouth daily.    . isosorbide dinitrate (ISOCHRON) 40 MG CR tablet Take 40 mg by mouth every 12 (twelve) hours.    . metFORMIN (GLUCOPHAGE) 500 MG tablet Take 500 mg by mouth 2 (two) times daily  with a meal.    . metroNIDAZOLE (METROGEL) 1 % gel Apply 1 application topically daily as needed (rosacia).     . minocycline (MINOCIN,DYNACIN) 100 MG capsule Take 100 mg by mouth daily.    Marland Kitchen NIFEdipine (PROCARDIA XL/ADALAT-CC) 90 MG 24 hr tablet Take 1 tablet by mouth once a day 90 tablet 2  . nitroGLYCERIN (NITROSTAT) 0.4 MG SL tablet Place 0.4 mg under the tongue every 5 (five) minutes as needed.      . potassium chloride SA (K-DUR,KLOR-CON) 20 MEQ tablet Take 20 mEq by mouth daily.    Marland Kitchen telmisartan (MICARDIS) 80 MG tablet Take 80 mg by mouth daily.      Marland Kitchen warfarin (COUMADIN) 5 MG tablet Take 7.5 mg by mouth daily.      No current facility-administered medications for this visit.    Allergies:    Allergies  Allergen Reactions  . Amiodarone Other (See Comments)    Thyroid disfunction  . Erythromycin     Patient unsure of reaction, was  told he was allergic.  Marland Kitchen Penicillins Rash    Happened in childhood.    Social History:  The patient  reports that he quit smoking about 30 years ago. His smoking use included Cigarettes. He smoked 0.00 packs per day. He has never used smokeless tobacco. He reports that he does not drink alcohol or use illicit drugs.   ROS:  Please see the history of present illness.   No blood in urine. Denies abdominal pain, nausea, dysphagia, transient neurological symptoms, headache, syncope, but does have stable claudication.   All other systems reviewed and negative.   OBJECTIVE: VS:  BP 130/70 mmHg  Pulse 51  Ht 5\' 10"  (1.778 m)  Wt 237 lb (107.502 kg)  BMI 34.01 kg/m2 Well nourished, well developed, in no acute distress, obese but relatively healthy appearing HEENT: normal Neck: JVD flat. Carotid bruit absent  Cardiac:  normal S1, S2; RRR; no murmur Lungs:  clear to auscultation bilaterally, no wheezing, rhonchi or rales Abd: soft, nontender, no hepatomegaly Ext: Edema absent. Pulses 2+ Skin: warm and dry Neuro:  CNs 2-12 intact, no focal abnormalities noted  EKG:  Normal sinus rhythm/sinus bradycardia with left anterior hemiblock, left ventricular hypertrophy, and evidence of prior septal infarct.       Signed, Illene Labrador III, MD 07/15/2014 8:33 AM

## 2014-07-17 ENCOUNTER — Encounter: Payer: Self-pay | Admitting: *Deleted

## 2014-08-31 ENCOUNTER — Encounter: Payer: Self-pay | Admitting: Podiatry

## 2014-08-31 ENCOUNTER — Ambulatory Visit (INDEPENDENT_AMBULATORY_CARE_PROVIDER_SITE_OTHER): Payer: Medicare Other | Admitting: Podiatry

## 2014-08-31 VITALS — BP 130/60 | HR 59 | Resp 12

## 2014-08-31 DIAGNOSIS — L03012 Cellulitis of left finger: Secondary | ICD-10-CM

## 2014-08-31 MED ORDER — CLINDAMYCIN HCL 150 MG PO CAPS
150.0000 mg | ORAL_CAPSULE | Freq: Four times a day (QID) | ORAL | Status: DC
Start: 2014-08-31 — End: 2014-10-07

## 2014-08-31 NOTE — Progress Notes (Signed)
   Subjective:    Patient ID: [name], male    DOB: 06-Nov-1945, 68 y.o.   MRN: [id number]  HPI N-SORE, THROBS L-B/L  1ST TOENAIL D- 6 DAYS O-SUDDENLY C-WORSE A-PRESSURE T-SOAK WITH EPSON SALT, COLD/HEAT PAD   Review of Systems  Hematological: Bruises/bleeds easily.  All other systems reviewed and are negative.      Objective:   Physical Exam  Orientated 3 patient presents with wife  Vascular: DP right 0/4 DP left 2/4 PT right 0/4 PT left 2/4  Neurological: Sensation to 10 g monofilament wire 5/5 right and 4/5 left Ankle reflex equal and reactive bilaterally Vibratory sensation intact bilaterally  Dermatological: Hyperpigmentation skin lower legs and ankles bilaterally Left hallux nail has low-grade erythema edema in the posterior medial nail fold area without any drainage Left hallux medial border has some dystrophic changes and was gently debrided without any drainage noted  Musculoskeletal: Hammertoe deformities 2-5 bilaterally      Assessment & Plan:   Assessment: Decrease pedal pulses consistent with known history of peripheral arterial disease under the management of Dr.Fields Paronychia left hallux  Plan: Rx clindamycin 150 mg by mouth 4 times a day 10 days

## 2014-08-31 NOTE — Patient Instructions (Addendum)
Take oral antibiotics by mouth 1    4 times a day 10 days  Diabetes and Foot Care Diabetes may cause you to have problems because of poor blood supply (circulation) to your feet and legs. This may cause the skin on your feet to become thinner, break easier, and heal more slowly. Your skin may become dry, and the skin may peel and crack. You may also have nerve damage in your legs and feet causing decreased feeling in them. You may not notice minor injuries to your feet that could lead to infections or more serious problems. Taking care of your feet is one of the most important things you can do for yourself.  HOME CARE INSTRUCTIONS  Wear shoes at all times, even in the house. Do not go barefoot. Bare feet are easily injured.  Check your feet daily for blisters, cuts, and redness. If you cannot see the bottom of your feet, use a mirror or ask someone for help.  Wash your feet with warm water (do not use hot water) and mild soap. Then pat your feet and the areas between your toes until they are completely dry. Do not soak your feet as this can dry your skin.  Apply a moisturizing lotion or petroleum jelly (that does not contain alcohol and is unscented) to the skin on your feet and to dry, brittle toenails. Do not apply lotion between your toes.  Trim your toenails straight across. Do not dig under them or around the cuticle. File the edges of your nails with an emery board or nail file.  Do not cut corns or calluses or try to remove them with medicine.  Wear clean socks or stockings every day. Make sure they are not too tight. Do not wear knee-high stockings since they may decrease blood flow to your legs.  Wear shoes that fit properly and have enough cushioning. To break in new shoes, wear them for just a few hours a day. This prevents you from injuring your feet. Always look in your shoes before you put them on to be sure there are no objects inside.  Do not cross your legs. This may  decrease the blood flow to your feet.  If you find a minor scrape, cut, or break in the skin on your feet, keep it and the skin around it clean and dry. These areas may be cleansed with mild soap and water. Do not cleanse the area with peroxide, alcohol, or iodine.  When you remove an adhesive bandage, be sure not to damage the skin around it.  If you have a wound, look at it several times a day to make sure it is healing.  Do not use heating pads or hot water bottles. They may burn your skin. If you have lost feeling in your feet or legs, you may not know it is happening until it is too late.  Make sure your health care provider performs a complete foot exam at least annually or more often if you have foot problems. Report any cuts, sores, or bruises to your health care provider immediately. SEEK MEDICAL CARE IF:   You have an injury that is not healing.  You have cuts or breaks in the skin.  You have an ingrown nail.  You notice redness on your legs or feet.  You feel burning or tingling in your legs or feet.  You have pain or cramps in your legs and feet.  Your legs or feet are numb.  Your feet always feel cold. SEEK IMMEDIATE MEDICAL CARE IF:   There is increasing redness, swelling, or pain in or around a wound.  There is a red line that goes up your leg.  Pus is coming from a wound.  You develop a fever or as directed by your health care provider.  You notice a bad smell coming from an ulcer or wound. Document Released: 08/16/2000 Document Revised: 04/21/2013 Document Reviewed: 01/26/2013 [facility] Patient Information 2015 [place], [place]. This information is not intended to replace advice given to you by your health care provider. Make sure you discuss any questions you have with your health care provider.

## 2014-09-01 ENCOUNTER — Encounter: Payer: Self-pay | Admitting: Podiatry

## 2014-09-07 ENCOUNTER — Encounter: Payer: Self-pay | Admitting: Interventional Cardiology

## 2014-09-14 ENCOUNTER — Encounter: Payer: Self-pay | Admitting: Podiatry

## 2014-09-14 ENCOUNTER — Ambulatory Visit (INDEPENDENT_AMBULATORY_CARE_PROVIDER_SITE_OTHER): Payer: 59 | Admitting: Podiatry

## 2014-09-14 VITALS — BP 135/66 | HR 64 | Temp 98.6°F | Resp 12

## 2014-09-14 DIAGNOSIS — E1151 Type 2 diabetes mellitus with diabetic peripheral angiopathy without gangrene: Secondary | ICD-10-CM

## 2014-09-14 DIAGNOSIS — B351 Tinea unguium: Secondary | ICD-10-CM

## 2014-09-14 NOTE — Progress Notes (Signed)
Patient ID: [name], male   DOB: 1946/08/26, 69 y.o.   MRN: [id number]  Subjective: This patient presents for follow-up care for paronychia left hallux and requests debridement of toenails. He has completed all clindamycin prescribed the initial visit of 08/31/2014 and denies any complaint from medication.  Objective: The medial margin left hallux toenail was debrided on the initial visit and demonstrates no erythema, edema, drainage, warmth. The toenails are brittle, incurvated, discolored 6-10  Assessment: Resolved paronychia left hallux Onychomycoses 6-10 History of diabetic peripheral arterial disease  Plan: Debridement toenails 10 without a bleeding Reappoint at three-month intervals

## 2014-09-14 NOTE — Patient Instructions (Signed)
Diabetes and Foot Care Diabetes may cause you to have problems because of poor blood supply (circulation) to your feet and legs. This may cause the skin on your feet to become thinner, break easier, and heal more slowly. Your skin may become dry, and the skin may peel and crack. You may also have nerve damage in your legs and feet causing decreased feeling in them. You may not notice minor injuries to your feet that could lead to infections or more serious problems. Taking care of your feet is one of the most important things you can do for yourself.  HOME CARE INSTRUCTIONS  Wear shoes at all times, even in the house. Do not go barefoot. Bare feet are easily injured.  Check your feet daily for blisters, cuts, and redness. If you cannot see the bottom of your feet, use a mirror or ask someone for help.  Wash your feet with warm water (do not use hot water) and mild soap. Then pat your feet and the areas between your toes until they are completely dry. Do not soak your feet as this can dry your skin.  Apply a moisturizing lotion or petroleum jelly (that does not contain alcohol and is unscented) to the skin on your feet and to dry, brittle toenails. Do not apply lotion between your toes.  Trim your toenails straight across. Do not dig under them or around the cuticle. File the edges of your nails with an emery board or nail file.  Do not cut corns or calluses or try to remove them with medicine.  Wear clean socks or stockings every day. Make sure they are not too tight. Do not wear knee-high stockings since they may decrease blood flow to your legs.  Wear shoes that fit properly and have enough cushioning. To break in new shoes, wear them for just a few hours a day. This prevents you from injuring your feet. Always look in your shoes before you put them on to be sure there are no objects inside.  Do not cross your legs. This may decrease the blood flow to your feet.  If you find a minor scrape,  cut, or break in the skin on your feet, keep it and the skin around it clean and dry. These areas may be cleansed with mild soap and water. Do not cleanse the area with peroxide, alcohol, or iodine.  When you remove an adhesive bandage, be sure not to damage the skin around it.  If you have a wound, look at it several times a day to make sure it is healing.  Do not use heating pads or hot water bottles. They may burn your skin. If you have lost feeling in your feet or legs, you may not know it is happening until it is too late.  Make sure your health care provider performs a complete foot exam at least annually or more often if you have foot problems. Report any cuts, sores, or bruises to your health care provider immediately. SEEK MEDICAL CARE IF:   You have an injury that is not healing.  You have cuts or breaks in the skin.  You have an ingrown nail.  You notice redness on your legs or feet.  You feel burning or tingling in your legs or feet.  You have pain or cramps in your legs and feet.  Your legs or feet are numb.  Your feet always feel cold. SEEK IMMEDIATE MEDICAL CARE IF:   There is increasing redness,   swelling, or pain in or around a wound.  There is a red line that goes up your leg.  Pus is coming from a wound.  You develop a fever or as directed by your health care provider.  You notice a bad smell coming from an ulcer or wound. Document Released: 08/16/2000 Document Revised: 04/21/2013 Document Reviewed: 01/26/2013 [employer] Patient Information 2015 [employer]. This information is not intended to replace advice given to you by your health care provider. Make sure you discuss any questions you have with your health care provider.  

## 2014-10-03 DIAGNOSIS — B351 Tinea unguium: Secondary | ICD-10-CM

## 2014-10-03 HISTORY — DX: Tinea unguium: B35.1

## 2014-10-07 ENCOUNTER — Other Ambulatory Visit: Payer: Self-pay | Admitting: Podiatry

## 2014-10-07 ENCOUNTER — Telehealth: Payer: Self-pay

## 2014-10-07 DIAGNOSIS — L03012 Cellulitis of left finger: Secondary | ICD-10-CM

## 2014-10-07 MED ORDER — CLINDAMYCIN HCL 150 MG PO CAPS: 150.0000 mg | ORAL_CAPSULE | Freq: Four times a day (QID) | ORAL | Status: DC

## 2014-10-07 NOTE — Telephone Encounter (Signed)
Pt called stating that the infection in his toe has come back, states that it is red and swollen with some drainage. Advise patient to soak toe, antibiotics were prescribed and appt was made for pt to see the doctor

## 2014-10-10 ENCOUNTER — Ambulatory Visit (INDEPENDENT_AMBULATORY_CARE_PROVIDER_SITE_OTHER): Payer: 59 | Admitting: Podiatry

## 2014-10-10 ENCOUNTER — Encounter: Payer: Self-pay | Admitting: Podiatry

## 2014-10-10 VITALS — BP 152/62 | HR 62 | Temp 97.1°F | Resp 16

## 2014-10-10 DIAGNOSIS — L03012 Cellulitis of left finger: Secondary | ICD-10-CM

## 2014-10-10 MED ORDER — CLINDAMYCIN HCL 150 MG PO CAPS: 150.0000 mg | ORAL_CAPSULE | Freq: Four times a day (QID) | ORAL | Status: DC

## 2014-10-10 NOTE — Patient Instructions (Signed)
Continue taking clindamycin 150 mg 1 capsule 4 times a day times a day x 10 days

## 2014-10-11 ENCOUNTER — Telehealth: Payer: Self-pay | Admitting: *Deleted

## 2014-10-11 DIAGNOSIS — L03012 Cellulitis of left finger: Secondary | ICD-10-CM

## 2014-10-11 MED ORDER — CLINDAMYCIN HCL 150 MG PO CAPS: 150.0000 mg | ORAL_CAPSULE | Freq: Four times a day (QID) | ORAL | Status: DC

## 2014-10-11 NOTE — Progress Notes (Signed)
Patient ID: [name], male   DOB: [birth year], 69 y.o.   MRN: [id number] Subjective: This patient presents for follow-up complaining of redness on the posterior nail fold area of a left hallux. He initially presented on 08/31/2014 for a similar problem and that time clindamycin 150 mg 4 times a day 10 days was prescribed. The symptoms resolved after oral antibiotic therapy. In the last several days he  noticed some increasing redness in the posterior left hallux nail and clindamycin 150 mg 4 times a day was prescribed for several days which is currently taking.   Objective: Orientated 3  The left hallux posterior nail fold has some local erythema and edema without any drainage. The nail plate has some dystrophic changes within it. I debrided the dystrophic changes to the posterior nail fold area without any bleeding and no drainage was noted.  Assessment: Recurrence of paronychia left History of diabetic peripheral arterial disease  Plan: Continue on clindamycin 150 mg by mouth 4 times a day 10 days  Reappoint 2 weeks

## 2014-10-11 NOTE — Telephone Encounter (Addendum)
Pt states he was suppose to get 10 days of Clindamycin, but was only refilled as 4 days.  I will refill for the remaining 24 capsules of Clindamycin.  Left message at pt's home phone that the Clindamycin was sent to his pharmacy.

## 2014-10-11 NOTE — Telephone Encounter (Signed)
Pt asked how long the 10/10/2014 dressing should stay in place.  I instructed pt to check the toenail area if there was soreness or bleeding to continue antibiotic ointment dressings for a couple more days.  Pt agreed and stated he needed the Clindamycin 150mg  called in as 1 rx for 40 capsules, not 2 rx = 40.  I informed pt I would take care of it.

## 2014-10-21 ENCOUNTER — Ambulatory Visit: Payer: Self-pay | Admitting: Family

## 2014-10-21 ENCOUNTER — Other Ambulatory Visit (HOSPITAL_COMMUNITY): Payer: Self-pay

## 2014-10-21 ENCOUNTER — Encounter (HOSPITAL_COMMUNITY): Payer: Self-pay

## 2014-10-26 ENCOUNTER — Ambulatory Visit (INDEPENDENT_AMBULATORY_CARE_PROVIDER_SITE_OTHER): Payer: 59 | Admitting: Podiatry

## 2014-10-26 ENCOUNTER — Encounter: Payer: Self-pay | Admitting: Podiatry

## 2014-10-26 VITALS — BP 141/63 | HR 52 | Temp 96.4°F | Resp 13

## 2014-10-26 DIAGNOSIS — L03032 Cellulitis of left toe: Secondary | ICD-10-CM

## 2014-10-26 DIAGNOSIS — L03012 Cellulitis of left finger: Secondary | ICD-10-CM

## 2014-10-26 NOTE — Progress Notes (Signed)
Patient ID: [name], male   DOB: 13-Jan-1946, 69 y.o.   MRN: [id number]  Subjective: Patient presents for follow-up visit for paronychia left hallux from the visit of 10/10/2014. He is completed clindamycin 150 mg by mouth 4 times a day without a complaint. He has a history of a previous paronychia in the posterior nail fold area that resolved with oral medication  Objective: Orientated 3 patient presents with his wife The posterior medial nail fold area is debrided back. There is no erythema, edema, warmth, drainage noted in the posterior nail fold area  Assessment: Resolved paronychia left hallux  Plan: DC clindamycin Advised patient apply Vaseline or skin lotion to the cuticle area in the left hallux nail daily  Reappoint for nail debridement previously scheduled visit

## 2014-10-26 NOTE — Patient Instructions (Signed)
Apply skin lotion or Vaseline to the cuticle area on the left great toe nail area daily  Diabetes and Foot Care Diabetes may cause you to have problems because of poor blood supply (circulation) to your feet and legs. This may cause the skin on your feet to become thinner, break easier, and heal more slowly. Your skin may become dry, and the skin may peel and crack. You may also have nerve damage in your legs and feet causing decreased feeling in them. You may not notice minor injuries to your feet that could lead to infections or more serious problems. Taking care of your feet is one of the most important things you can do for yourself.  HOME CARE INSTRUCTIONS  Wear shoes at all times, even in the house. Do not go barefoot. Bare feet are easily injured.  Check your feet daily for blisters, cuts, and redness. If you cannot see the bottom of your feet, use a mirror or ask someone for help.  Wash your feet with warm water (do not use hot water) and mild soap. Then pat your feet and the areas between your toes until they are completely dry. Do not soak your feet as this can dry your skin.  Apply a moisturizing lotion or petroleum jelly (that does not contain alcohol and is unscented) to the skin on your feet and to dry, brittle toenails. Do not apply lotion between your toes.  Trim your toenails straight across. Do not dig under them or around the cuticle. File the edges of your nails with an emery board or nail file.  Do not cut corns or calluses or try to remove them with medicine.  Wear clean socks or stockings every day. Make sure they are not too tight. Do not wear knee-high stockings since they may decrease blood flow to your legs.  Wear shoes that fit properly and have enough cushioning. To break in new shoes, wear them for just a few hours a day. This prevents you from injuring your feet. Always look in your shoes before you put them on to be sure there are no objects inside.  Do not cross  your legs. This may decrease the blood flow to your feet.  If you find a minor scrape, cut, or break in the skin on your feet, keep it and the skin around it clean and dry. These areas may be cleansed with mild soap and water. Do not cleanse the area with peroxide, alcohol, or iodine.  When you remove an adhesive bandage, be sure not to damage the skin around it.  If you have a wound, look at it several times a day to make sure it is healing.  Do not use heating pads or hot water bottles. They may burn your skin. If you have lost feeling in your feet or legs, you may not know it is happening until it is too late.  Make sure your health care provider performs a complete foot exam at least annually or more often if you have foot problems. Report any cuts, sores, or bruises to your health care provider immediately. SEEK MEDICAL CARE IF:   You have an injury that is not healing.  You have cuts or breaks in the skin.  You have an ingrown nail.  You notice redness on your legs or feet.  You feel burning or tingling in your legs or feet.  You have pain or cramps in your legs and feet.  Your legs or feet are  numb.  Your feet always feel cold. SEEK IMMEDIATE MEDICAL CARE IF:   There is increasing redness, swelling, or pain in or around a wound.  There is a red line that goes up your leg.  Pus is coming from a wound.  You develop a fever or as directed by your health care provider.  You notice a bad smell coming from an ulcer or wound. Document Released: 08/16/2000 Document Revised: 04/21/2013 Document Reviewed: 01/26/2013 [facility] Patient Information 2015 [place], [place]. This information is not intended to replace advice given to you by your health care provider. Make sure you discuss any questions you have with your health care provider.

## 2014-11-02 ENCOUNTER — Encounter: Payer: Self-pay | Admitting: Vascular Surgery

## 2014-11-03 ENCOUNTER — Ambulatory Visit (INDEPENDENT_AMBULATORY_CARE_PROVIDER_SITE_OTHER): Payer: Medicare Other | Admitting: Vascular Surgery

## 2014-11-03 ENCOUNTER — Ambulatory Visit (INDEPENDENT_AMBULATORY_CARE_PROVIDER_SITE_OTHER)
Admission: RE | Admit: 2014-11-03 | Discharge: 2014-11-03 | Disposition: A | Discharge status: Home / Self Care | Payer: Medicare Other | Source: Ambulatory Visit | Attending: Vascular Surgery | Admitting: Vascular Surgery

## 2014-11-03 ENCOUNTER — Ambulatory Visit (HOSPITAL_COMMUNITY)
Admission: RE | Admit: 2014-11-03 | Discharge: 2014-11-03 | Disposition: A | Discharge status: Home / Self Care | Payer: Medicare Other | Source: Ambulatory Visit | Attending: Vascular Surgery | Admitting: Vascular Surgery

## 2014-11-03 ENCOUNTER — Other Ambulatory Visit: Payer: Self-pay

## 2014-11-03 ENCOUNTER — Encounter: Payer: Self-pay | Admitting: Vascular Surgery

## 2014-11-03 VITALS — BP 152/75 | HR 51 | Resp 18 | Ht 69.5 in | Wt 244.5 lb

## 2014-11-03 DIAGNOSIS — I739 Peripheral vascular disease, unspecified: Secondary | ICD-10-CM | POA: Diagnosis present

## 2014-11-03 DIAGNOSIS — I724 Aneurysm of artery of lower extremity: Secondary | ICD-10-CM

## 2014-11-03 DIAGNOSIS — Z48812 Encounter for surgical aftercare following surgery on the circulatory system: Secondary | ICD-10-CM

## 2014-11-03 NOTE — Progress Notes (Signed)
[facility] [place] HISTORY AND PHYSICAL    CC:follow up left leg bypass Referring Physician:James Osborne, MD  History of Present Illness:  Patient is a 68 y.o. 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 This bypass was done in 2008 by Dr. Hayes. This was done with a vein graft. He previously had vein harvested and radial artery harvested for coronary bypass. He occasionally has some right calf cramps but these did not really somewhat claudication. Of note he has had a previous right superficial femoral artery stent by Dr. Varanasi also in 2008. Patient also has a history of atrial fibrillation. He is on Coumadin. Other chronic medical problems include hypertension, hyperlipidemia, coronary artery disease, diabetes all of which are currently stable.  He was recently treated by his podiatrist for a left toe nail infection. He states this is improving.     Past Medical History  Diagnosis Date  . Reflux   . Leg pain 03/17/2009    With walking  . Chest pain   . Hypertension   . Hyperlipidemia   . Myocardial infarction   . CHF (congestive heart failure)   . Diabetes mellitus Age 45  . Popliteal aneurysm     repaired left side Dr Hayes 2008  . PAD (peripheral artery disease)     Right SFA stent  . Hx of CABG 1993  . Ischemic cardiomyopathy   . Afib   . Retinopathy   . PVD (peripheral vascular disease)   . Tubular adenoma   . Arthritis     in back  . Heart attack   . Weight loss, unintentional   . Trouble swallowing   . Change in voice   . Cough   . Wheezing   . Abdominal pain   . Weakness   . Generalized headaches   . Rash   . Bruises easily   . Diarrhea   . Leg swelling   . Hyperthyroidism   . Coronary artery disease   . Fungal infection of toenail 10-2014    left great toe    Past Surgical History  Procedure Laterality Date  . Back surgery  1994    L5 discectomy  .  Cataract extraction, bilateral    . Coronary artery bypass graft  1993-2001    X2  . Pr vein bypass graft,aorto-fem-pop  2008    ROS: [x] Positive   [ ] Negative   [X ] All sytems reviewed and are negative  General:[ ] Weight loss, [ ] Fever, [ ] chills Neurologic: [ ] Dizziness, [ ] Blackouts, [ ] Seizure [ ] Stroke, [ ] "Mini stroke", [ ] Slurred speech, [ ] Temporary blindness;  [ ]weakness,  [ ] Hoarseness Cardiac: [ ] Chest pain/pressure, [ ] Shortness of breath at rest [ ] Shortness of breath with exertion,  [ ]  Atrial fibrillation or irregular heartbeat Vascular:[ ] Pain in legs with walking, [ ] Pain in legs at rest ,[ ] Pain in legs at night,  [ ]  Non-healing ulcer, [ ] Blood clot in vein/DVT,    Pulmonary: [ ] Home oxygen, [ ]  Productive cough, [ ] Coughing up blood,  [ ] Asthma,  [ ] Wheezing Musculoskeletal:  [ ] Arthritis, [ ] Low back pain,  [ ] Joint pain Hematologic:[ ] Easy Bruising, [ ] Anemia; [ ] Hepatitis Gastrointestinal: [ ] Blood in stool,  [ ]   Gastroesophageal Reflux, [ ] Trouble swallowing Urinary: [ ] chronic Kidney disease, [ ] on HD - [ ] MWF or [ ] TTHS, [ ] Burning with urination, [ ] Frequent urination, [ ] Difficulty urinating;   Skin: [ ] Rashes, [ ] Wounds Psychological: [ ] Anxiety,  [ ] Depression  Social History History   Substance Use Topics   .  Smoking status:  Former Smoker       Types:  Cigarettes       Quit date:  09/03/1983   .  Smokeless tobacco:  Not on file   .  Alcohol Use:  No     Family History Family History   Problem  Relation  Age of Onset   .  Heart disease  Other       Allergies    Allergies   Allergen  Reactions   .  Penicillins           Current Outpatient Prescriptions on File Prior to Visit  Medication Sig Dispense Refill  . acetaminophen (TYLENOL) 500 MG tablet Take 1,000 mg by mouth 2 (two) times daily as needed for pain (pain/headache).    . atorvastatin (LIPITOR) 40 MG tablet Take 40 mg by mouth  daily.      . Blood Glucose Monitoring Suppl (ACCU-CHEK AVIVA PLUS) W/DEVICE KIT     . carvedilol (COREG) 25 MG tablet Take 25 mg by mouth 2 (two) times daily with a meal.      . clindamycin (CLEOCIN) 150 MG capsule Take 1 capsule (150 mg total) by mouth 4 (four) times daily. 40 capsule 0  . clopidogrel (PLAVIX) 75 MG tablet Take 75 mg by mouth daily.      . furosemide (LASIX) 80 MG tablet Take 80 mg by mouth daily.    . isosorbide dinitrate (ISOCHRON) 40 MG CR tablet Take 40 mg by mouth every 12 (twelve) hours.    . metFORMIN (GLUCOPHAGE) 500 MG tablet Take 500 mg by mouth 2 (two) times daily with a meal.    . metFORMIN (GLUCOPHAGE-XR) 500 MG 24 hr tablet     . metroNIDAZOLE (METROGEL) 1 % gel Apply 1 application topically daily as needed (rosacia).     . minocycline (MINOCIN,DYNACIN) 100 MG capsule Take 100 mg by mouth daily.    . NIFEdipine (PROCARDIA XL/ADALAT-CC) 90 MG 24 hr tablet Take 1 tablet by mouth once a day 90 tablet 2  . nitroGLYCERIN (NITROSTAT) 0.4 MG SL tablet Place 0.4 mg under the tongue every 5 (five) minutes as needed.      . potassium chloride SA (K-DUR,KLOR-CON) 20 MEQ tablet Take 20 mEq by mouth daily.    . telmisartan (MICARDIS) 80 MG tablet Take 80 mg by mouth daily.      . warfarin (COUMADIN) 5 MG tablet Take 7.5 mg by mouth daily.      No current facility-administered medications on file prior to visit.    Physical Examination     Filed Vitals:   11/03/14 1302  BP: 152/75  Pulse: 51  Resp: 18  Height: 5' 9.5" (1.765 m)  Weight: 244 lb 8 oz (110.904 kg)    General:  Alert and oriented, no acute distress HEENT: Normal Neck: No bruit or JVD Pulmonary: Clear to auscultation bilaterally Cardiac: Regular Rate and Rhythm without murmur Gastrointestinal: Soft, non-tender, non-distended, no mass, no scars Skin: No rash Extremity Pulses:  2+ radial, brachial, femoral, dorsalis pedis pulses bilaterally Musculoskeletal: No deformity or edema       Neurologic:  Upper and lower extremity motor 5/5 and symmetric  DATA: Noninvasive vascular exam shows a patent left lower extremity above-knee to below-knee popliteal bypass with no increased velocities.  In the right lower extremity the right superficial femoral artery stent is patent however velocities are increased to 449 cm/s proximal aspect. Waveforms were biphasic in the right leg triphasic in the left leg vessels were calcified noncompressible for ABIs.  ASSESSMENT:   Doing well status post repair of left popliteal aneurysm. The patient had a CT scan of abdomen and pelvis in 2007 which showed no abdominal aortic aneurysm. He currently is asymptomatic.  Right superficial femoral artery stent. Does not really have symptoms of claudication but has a duplex suggested of greater than 50% stenosis on the right side.   PLAN: #1 aortogram with lower extremity runoff scheduled for 11/11/2014. We will stop his Coumadin 3 days prior to this and bridge with Lovenox. If he does have a stenosis confirmed in the right superficial femoral artery we will consider an intervention at that time. Risks benefits possible, patient's and procedure details were expanded the patient today. He understands and agrees to proceed.  Charles Fields, MD [facility] [place] Office: [phone] Pager: [phone]  

## 2014-11-11 ENCOUNTER — Encounter (HOSPITAL_COMMUNITY): Payer: Self-pay | Admitting: Vascular Surgery

## 2014-11-11 ENCOUNTER — Other Ambulatory Visit: Payer: Self-pay | Admitting: *Deleted

## 2014-11-11 ENCOUNTER — Ambulatory Visit (HOSPITAL_COMMUNITY)
Admission: RE | Admit: 2014-11-11 | Discharge: 2014-11-11 | Disposition: A | Discharge status: Home / Self Care | Payer: Medicare Other | Source: Ambulatory Visit | Attending: Vascular Surgery | Admitting: Vascular Surgery

## 2014-11-11 ENCOUNTER — Encounter (HOSPITAL_COMMUNITY)
Admission: RE | Disposition: A | Discharge status: Home / Self Care | Payer: Self-pay | Source: Ambulatory Visit | Attending: Vascular Surgery

## 2014-11-11 DIAGNOSIS — Z951 Presence of aortocoronary bypass graft: Secondary | ICD-10-CM | POA: Diagnosis not present

## 2014-11-11 DIAGNOSIS — I509 Heart failure, unspecified: Secondary | ICD-10-CM | POA: Diagnosis not present

## 2014-11-11 DIAGNOSIS — I10 Essential (primary) hypertension: Secondary | ICD-10-CM | POA: Diagnosis not present

## 2014-11-11 DIAGNOSIS — E785 Hyperlipidemia, unspecified: Secondary | ICD-10-CM | POA: Insufficient documentation

## 2014-11-11 DIAGNOSIS — I724 Aneurysm of artery of lower extremity: Secondary | ICD-10-CM | POA: Insufficient documentation

## 2014-11-11 DIAGNOSIS — Z9889 Other specified postprocedural states: Secondary | ICD-10-CM | POA: Insufficient documentation

## 2014-11-11 DIAGNOSIS — I2510 Atherosclerotic heart disease of native coronary artery without angina pectoris: Secondary | ICD-10-CM | POA: Diagnosis not present

## 2014-11-11 DIAGNOSIS — Z87891 Personal history of nicotine dependence: Secondary | ICD-10-CM | POA: Diagnosis not present

## 2014-11-11 DIAGNOSIS — I739 Peripheral vascular disease, unspecified: Secondary | ICD-10-CM

## 2014-11-11 DIAGNOSIS — E0590 Thyrotoxicosis, unspecified without thyrotoxic crisis or storm: Secondary | ICD-10-CM | POA: Insufficient documentation

## 2014-11-11 DIAGNOSIS — I4891 Unspecified atrial fibrillation: Secondary | ICD-10-CM | POA: Diagnosis not present

## 2014-11-11 DIAGNOSIS — I70201 Unspecified atherosclerosis of native arteries of extremities, right leg: Secondary | ICD-10-CM | POA: Diagnosis present

## 2014-11-11 DIAGNOSIS — E119 Type 2 diabetes mellitus without complications: Secondary | ICD-10-CM | POA: Insufficient documentation

## 2014-11-11 DIAGNOSIS — I252 Old myocardial infarction: Secondary | ICD-10-CM | POA: Diagnosis not present

## 2014-11-11 DIAGNOSIS — Z48812 Encounter for surgical aftercare following surgery on the circulatory system: Secondary | ICD-10-CM

## 2014-11-11 DIAGNOSIS — I70202 Unspecified atherosclerosis of native arteries of extremities, left leg: Secondary | ICD-10-CM | POA: Insufficient documentation

## 2014-11-11 DIAGNOSIS — K219 Gastro-esophageal reflux disease without esophagitis: Secondary | ICD-10-CM | POA: Diagnosis not present

## 2014-11-11 DIAGNOSIS — Z7902 Long term (current) use of antithrombotics/antiplatelets: Secondary | ICD-10-CM | POA: Diagnosis not present

## 2014-11-11 DIAGNOSIS — Z792 Long term (current) use of antibiotics: Secondary | ICD-10-CM | POA: Insufficient documentation

## 2014-11-11 DIAGNOSIS — Z7901 Long term (current) use of anticoagulants: Secondary | ICD-10-CM | POA: Insufficient documentation

## 2014-11-11 HISTORY — PX: ABDOMINAL AORTAGRAM: SHX5454

## 2014-11-11 LAB — POCT I-STAT, CHEM 8
BUN: 14 mg/dL (ref 6–23)
CREATININE: 0.9 mg/dL (ref 0.50–1.35)
Calcium, Ion: 1.21 mmol/L (ref 1.13–1.30)
Chloride: 104 mmol/L (ref 96–112)
GLUCOSE: 136 mg/dL — AB (ref 70–99)
HCT: 37 % — ABNORMAL LOW (ref 39.0–52.0)
Hemoglobin: 12.6 g/dL — ABNORMAL LOW (ref 13.0–17.0)
Potassium: 3.9 mmol/L (ref 3.5–5.1)
Sodium: 142 mmol/L (ref 135–145)
TCO2: 22 mmol/L (ref 0–100)

## 2014-11-11 LAB — PROTIME-INR
INR: 1.36 (ref 0.00–1.49)
Prothrombin Time: 16.9 seconds — ABNORMAL HIGH (ref 11.6–15.2)

## 2014-11-11 LAB — GLUCOSE, CAPILLARY: GLUCOSE-CAPILLARY: 100 mg/dL — AB (ref 70–99)

## 2014-11-11 SURGERY — ABDOMINAL AORTAGRAM
Anesthesia: LOCAL

## 2014-11-11 MED ORDER — LIDOCAINE HCL (PF) 1 % IJ SOLN
INTRAMUSCULAR | Status: AC
  Filled 2014-11-11: qty 30

## 2014-11-11 MED ORDER — HEPARIN (PORCINE) IN NACL 2-0.9 UNIT/ML-% IJ SOLN
INTRAMUSCULAR | Status: AC
  Filled 2014-11-11: qty 1000

## 2014-11-11 MED ORDER — MORPHINE SULFATE 10 MG/ML IJ SOLN: 2.0000 mg | INTRAMUSCULAR | Status: DC | PRN

## 2014-11-11 MED ORDER — ACETAMINOPHEN 325 MG PO TABS
325.0000 mg | ORAL_TABLET | ORAL | Status: DC | PRN
  Filled 2014-11-11: qty 2

## 2014-11-11 MED ORDER — METOPROLOL TARTRATE 1 MG/ML IV SOLN: 2.0000 mg | INTRAVENOUS | Status: DC | PRN

## 2014-11-11 MED ORDER — LABETALOL HCL 5 MG/ML IV SOLN
INTRAVENOUS | Status: AC
  Filled 2014-11-11: qty 4

## 2014-11-11 MED ORDER — HYDRALAZINE HCL 20 MG/ML IJ SOLN: 5.0000 mg | INTRAMUSCULAR | Status: DC | PRN

## 2014-11-11 MED ORDER — ACETAMINOPHEN 325 MG RE SUPP
325.0000 mg | RECTAL | Status: DC | PRN
  Filled 2014-11-11: qty 2

## 2014-11-11 MED ORDER — LABETALOL HCL 5 MG/ML IV SOLN: 10.0000 mg | INTRAVENOUS | Status: DC | PRN

## 2014-11-11 MED ORDER — SODIUM CHLORIDE 0.45 % IV SOLN: INTRAVENOUS | Status: DC

## 2014-11-11 MED ORDER — SODIUM CHLORIDE 0.9 % IV SOLN
INTRAVENOUS | Status: DC
  Administered 2014-11-11: 10:00:00 via INTRAVENOUS

## 2014-11-11 MED ORDER — ONDANSETRON HCL 4 MG/2ML IJ SOLN: 4.0000 mg | Freq: Four times a day (QID) | INTRAMUSCULAR | Status: DC | PRN

## 2014-11-11 NOTE — Discharge Instructions (Signed)

## 2014-11-11 NOTE — H&P (View-Only) (Signed)
[facility] [place] HISTORY AND PHYSICAL    ZD:[id number] up left leg bypass Referring Physician:James Maxwell Caul, MD  History of Present Illness:  Patient is a 69 y.o. 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 This bypass was done in 2008 by Dr. Amedeo Plenty. This was done with a vein graft. He previously had vein harvested and radial artery harvested for coronary bypass. He occasionally has some right calf cramps but these did not really somewhat claudication. Of note he has had a previous right superficial femoral artery stent by Dr. Irish Lack also in 2008. Patient also has a history of atrial fibrillation. He is on Coumadin. Other chronic medical problems include hypertension, hyperlipidemia, coronary artery disease, diabetes all of which are currently stable.  He was recently treated by his podiatrist for a left toe nail infection. He states this is improving.     Past Medical History  Diagnosis Date  . Reflux   . Leg pain 03/17/2009    With walking  . Chest pain   . Hypertension   . Hyperlipidemia   . Myocardial infarction   . CHF (congestive heart failure)   . Diabetes mellitus Age 42  . Popliteal aneurysm     repaired left side Dr Amedeo Plenty 2008  . PAD (peripheral artery disease)     Right SFA stent  . Hx of CABG 1993  . Ischemic cardiomyopathy   . Afib   . Retinopathy   . PVD (peripheral vascular disease)   . Tubular adenoma   . Arthritis     in back  . Heart attack   . Weight loss, unintentional   . Trouble swallowing   . Change in voice   . Cough   . Wheezing   . Abdominal pain   . Weakness   . Generalized headaches   . Rash   . Bruises easily   . Diarrhea   . Leg swelling   . Hyperthyroidism   . Coronary artery disease   . Fungal infection of toenail 10-2014    left great toe    Past Surgical History  Procedure Laterality Date  . Back surgery  1994    L5 discectomy  .  Cataract extraction, bilateral    . Coronary artery bypass graft  1993-2001    X2  . Pr vein bypass graft,aorto-fem-pop  2008    ROS: _0  Positive   _1  Negative   Valu.Nieves ] All sytems reviewed and are negative  General:_2  Weight loss, _3  Fever, _4  chills Neurologic: _5  Dizziness, _6  Blackouts, _7  Seizure _8  Stroke, _9  "Mini stroke", _10  Slurred speech, _11  Temporary blindness;  _12 weakness,  _13  Hoarseness Cardiac: _14  Chest pain/pressure, _15  Shortness of breath at rest _16  Shortness of breath with exertion,  _17   Atrial fibrillation or irregular heartbeat Vascular:_18  Pain in legs with walking, _19  Pain in legs at rest ,_20  Pain in legs at night,  _21   Non-healing ulcer, _22  Blood clot in vein/DVT,    Pulmonary: _23  Home oxygen, _24   Productive cough, _25  Coughing up blood,  _26  Asthma,  _27  Wheezing Musculoskeletal:  _28  Arthritis, _29  Low back pain,  _30  Joint pain Hematologic:_31  Easy Bruising, _32  Anemia; _33  Hepatitis Gastrointestinal: _34  Blood in stool,  _35   Gastroesophageal Reflux, _0  Trouble swallowing Urinary: _1  chronic Kidney disease, _2  on HD - _3  MWF or _4  TTHS, _5  Burning with urination, _6  Frequent urination, _7  Difficulty urinating;   Skin: _8  Rashes, _9  Wounds Psychological: _10  Anxiety,  _11  Depression  Social History History   Substance Use Topics   .  Smoking status:  Former Smoker       Types:  Cigarettes       Quit date:  09/03/1983   .  Smokeless tobacco:  Not on file   .  Alcohol Use:  No     Family History Family History   Problem  Relation  Age of Onset   .  Heart disease  Other       Allergies    Allergies   Allergen  Reactions   .  Penicillins           Current Outpatient Prescriptions on File Prior to Visit  Medication Sig Dispense Refill  . acetaminophen (TYLENOL) 500 MG tablet Take 1,000 mg by mouth 2 (two) times daily as needed for pain (pain/headache).    Marland Kitchen atorvastatin (LIPITOR) 40 MG tablet Take 40 mg by mouth  daily.      . Blood Glucose Monitoring Suppl (ACCU-CHEK AVIVA PLUS) W/DEVICE KIT     . carvedilol (COREG) 25 MG tablet Take 25 mg by mouth 2 (two) times daily with a meal.      . clindamycin (CLEOCIN) 150 MG capsule Take 1 capsule (150 mg total) by mouth 4 (four) times daily. 40 capsule 0  . clopidogrel (PLAVIX) 75 MG tablet Take 75 mg by mouth daily.      . furosemide (LASIX) 80 MG tablet Take 80 mg by mouth daily.    . isosorbide dinitrate (ISOCHRON) 40 MG CR tablet Take 40 mg by mouth every 12 (twelve) hours.    . metFORMIN (GLUCOPHAGE) 500 MG tablet Take 500 mg by mouth 2 (two) times daily with a meal.    . metFORMIN (GLUCOPHAGE-XR) 500 MG 24 hr tablet     . metroNIDAZOLE (METROGEL) 1 % gel Apply 1 application topically daily as needed (rosacia).     . minocycline (MINOCIN,DYNACIN) 100 MG capsule Take 100 mg by mouth daily.    Marland Kitchen NIFEdipine (PROCARDIA XL/ADALAT-CC) 90 MG 24 hr tablet Take 1 tablet by mouth once a day 90 tablet 2  . nitroGLYCERIN (NITROSTAT) 0.4 MG SL tablet Place 0.4 mg under the tongue every 5 (five) minutes as needed.      . potassium chloride SA (K-DUR,KLOR-CON) 20 MEQ tablet Take 20 mEq by mouth daily.    Marland Kitchen telmisartan (MICARDIS) 80 MG tablet Take 80 mg by mouth daily.      Marland Kitchen warfarin (COUMADIN) 5 MG tablet Take 7.5 mg by mouth daily.      No current facility-administered medications on file prior to visit.    Physical Examination     Filed Vitals:   11/03/14 1302  BP: 152/75  Pulse: 51  Resp: 18  Height: 5' 9.5" (1.765 m)  Weight: 244 lb 8 oz (110.904 kg)    General:  Alert and oriented, no acute distress HEENT: Normal Neck: No bruit or JVD Pulmonary: Clear to auscultation bilaterally Cardiac: Regular Rate and Rhythm without murmur Gastrointestinal: Soft, non-tender, non-distended, no mass, no scars Skin: No rash Extremity Pulses:  2+ radial, brachial, femoral, dorsalis pedis pulses bilaterally Musculoskeletal: No deformity or edema  Neurologic:  Upper and lower extremity motor 5/5 and symmetric  DATA: Noninvasive vascular exam shows a patent left lower extremity above-knee to below-knee popliteal bypass with no increased velocities.  In the right lower extremity the right superficial femoral artery stent is patent however velocities are increased to 449 cm/s proximal aspect. Waveforms were biphasic in the right leg triphasic in the left leg vessels were calcified noncompressible for ABIs.  ASSESSMENT:   Doing well status post repair of left popliteal aneurysm. The patient had a CT scan of abdomen and pelvis in 2007 which showed no abdominal aortic aneurysm. He currently is asymptomatic.  Right superficial femoral artery stent. Does not really have symptoms of claudication but has a duplex suggested of greater than 50% stenosis on the right side.   PLAN: #1 aortogram with lower extremity runoff scheduled for 11/11/2014. We will stop his Coumadin 3 days prior to this and bridge with Lovenox. If he does have a stenosis confirmed in the right superficial femoral artery we will consider an intervention at that time. Risks benefits possible, patient's and procedure details were expanded the patient today. He understands and agrees to proceed.  Ruta Hinds, MD [facility] Office: [phone] Pager: [phone]

## 2014-11-11 NOTE — Op Note (Signed)
Procedure: Aortogram with bilateral extremity runoff  Preoperative diagnosis: In-stent restenosis right leg  Postoperative diagnosis: Multilevel native superficial femoral artery stenosis  Anesthesia: Local  Operative findings: #1  50% stenosis right common iliac artery                                 #2 multilevel exophytic stenoses right superficial femoral artery extending from the proximal mid and distal segment                                 #3 widely patent right mid superficial femoral artery stent                                 #4 widely patent left femoral to below-knee popliteal bypass                                 #5 three-vessel runoff bilaterally  Operative details: After pain informed consent, the patient was taken to the [facility] lab. The patient was supine position on the Angio table. Both groins were prepped and draped in usual sterile fashion. Local anesthesia was infiltrated of the left common femoral artery. An introducer needle was used to cannulate the left common femoral artery. There was some calcium along the anterior wall but I was able to penetrate this with the introducer needle. An 035 versacore wire was threaded up the abdominal aorta and fluoroscopic guidance. Next a 5 French sheath placed over the guidewire into the left common femoral artery. This was thoroughly flushed with heparin saline. A 5 French pigtail catheter was then placed over the guidewire into the abdominal aorta. Abdominal aortogram was obtained in AP projection. Left and right renal arteries are patent. The infrarenal abdominal aorta is patent. There is a 50% narrowing of the right common iliac artery with some poststenotic dilatation. Are similar findings on the left common iliac artery although much less narrowing the internal/external iliac arteries are patent bilaterally. Next the pectoral catheter was pulled understood aortic bifurcation and magnified view was performed to confirm the above findings.  At this point bilateral extremity runoff views were obtained through the pigtail catheter.  In the right lower extremity, there is a 50% stenosis of the right common femoral artery. The profunda femoris patent. The right superficial femoral artery has multiple areas of exophytic plaque obstructing the lumen and 50-70% areas there is a right superficial femoral artery stent in the mid thigh which is widely patent. There are multiple exophytic 50-75 superficial femoral and popliteal artery below this. The below-knee popliteal artery is patent. There is three-vessel runoff to the right foot.  In the left lower extremity, there is a patent left common femoral artery. There is a bypass graft originating from the left common femoral artery with its distal anastomosis to the below-knee popliteal artery. This is widely patent. There is three-vessel runoff to the left foot.  At this point the apical catheter was removed over a guidewire. The 5 French sheath was thoroughly flushed with heparinized saline. The patient was taken to the holding area after removal of the sheath. Hemostasis was obtained with direct pressure.  Operative management: Currently the patient is minimally symptomatic in the right leg. If he develops worsening  symptoms of claudication in the right lower extremity or nonhealing wounds of the right lower extremity he would require a right common femoral endarterectomy plus minus a right femoral to below-knee popliteal bypass versus atherectomy of the right superficial femoral and popliteal arteries. The patient will follow-up with me with repeat ABIs and a graft duplex of both legs in one year. He'll call sooner if he feels his claudication symptoms are worsening.  Ruta Hinds, MD [facility] Office: [phone] Pager: [phone]

## 2014-11-11 NOTE — Interval H&P Note (Signed)
History and Physical Interval Note:  11/11/2014 10:30 AM  [name]  has presented today for surgery, with the diagnosis of Right SFA stenosis  The various methods of treatment have been discussed with the patient and family. After consideration of risks, benefits and other options for treatment, the patient has consented to  Procedure(s): ABDOMINAL AORTAGRAM (N/A) as a surgical intervention .  The patient's history has been reviewed, patient examined, no change in status, stable for surgery.  I have reviewed the patient's chart and labs.  Questions were answered to the patient's satisfaction.     FIELDS,CHARLES E

## 2014-12-14 ENCOUNTER — Ambulatory Visit: Payer: Medicare Other | Admitting: Podiatry

## 2014-12-21 ENCOUNTER — Other Ambulatory Visit: Payer: Self-pay | Admitting: Internal Medicine

## 2014-12-21 ENCOUNTER — Ambulatory Visit
Admission: RE | Admit: 2014-12-21 | Discharge: 2014-12-21 | Disposition: A | Discharge status: Home / Self Care | Payer: Medicare Other | Source: Ambulatory Visit | Attending: Internal Medicine | Admitting: Internal Medicine

## 2014-12-21 DIAGNOSIS — E042 Nontoxic multinodular goiter: Secondary | ICD-10-CM

## 2015-11-23 ENCOUNTER — Other Ambulatory Visit (HOSPITAL_COMMUNITY): Payer: Medicare Other

## 2015-11-23 ENCOUNTER — Ambulatory Visit: Payer: Medicare Other | Admitting: Vascular Surgery

## 2015-11-23 ENCOUNTER — Encounter (HOSPITAL_COMMUNITY): Payer: Medicare Other

## 2015-12-12 ENCOUNTER — Other Ambulatory Visit: Payer: Self-pay | Admitting: Internal Medicine

## 2015-12-12 DIAGNOSIS — I739 Peripheral vascular disease, unspecified: Secondary | ICD-10-CM

## 2015-12-12 DIAGNOSIS — E042 Nontoxic multinodular goiter: Secondary | ICD-10-CM

## 2015-12-13 ENCOUNTER — Encounter: Payer: Self-pay | Admitting: Vascular Surgery

## 2015-12-19 ENCOUNTER — Ambulatory Visit
Admission: RE | Admit: 2015-12-19 | Discharge: 2015-12-19 | Disposition: A | Discharge status: Home / Self Care | Payer: Medicare Other | Source: Ambulatory Visit | Attending: Internal Medicine | Admitting: Internal Medicine

## 2015-12-19 DIAGNOSIS — I739 Peripheral vascular disease, unspecified: Secondary | ICD-10-CM

## 2015-12-19 DIAGNOSIS — E042 Nontoxic multinodular goiter: Secondary | ICD-10-CM

## 2015-12-21 ENCOUNTER — Ambulatory Visit (HOSPITAL_COMMUNITY)
Admission: RE | Admit: 2015-12-21 | Discharge: 2015-12-21 | Disposition: A | Discharge status: Home / Self Care | Payer: Medicare Other | Source: Ambulatory Visit | Attending: Vascular Surgery | Admitting: Vascular Surgery

## 2015-12-21 ENCOUNTER — Encounter: Payer: Self-pay | Admitting: Vascular Surgery

## 2015-12-21 ENCOUNTER — Other Ambulatory Visit: Payer: Self-pay | Admitting: Vascular Surgery

## 2015-12-21 ENCOUNTER — Ambulatory Visit (INDEPENDENT_AMBULATORY_CARE_PROVIDER_SITE_OTHER): Payer: Medicare Other | Admitting: Vascular Surgery

## 2015-12-21 ENCOUNTER — Ambulatory Visit (INDEPENDENT_AMBULATORY_CARE_PROVIDER_SITE_OTHER)
Admission: RE | Admit: 2015-12-21 | Discharge: 2015-12-21 | Disposition: A | Discharge status: Home / Self Care | Payer: Medicare Other | Source: Ambulatory Visit | Attending: Vascular Surgery | Admitting: Vascular Surgery

## 2015-12-21 VITALS — BP 136/74 | HR 50 | Temp 97.0°F | Resp 14 | Ht 70.0 in | Wt 246.0 lb

## 2015-12-21 DIAGNOSIS — Y838 Other surgical procedures as the cause of abnormal reaction of the patient, or of later complication, without mention of misadventure at the time of the procedure: Secondary | ICD-10-CM | POA: Diagnosis not present

## 2015-12-21 DIAGNOSIS — I110 Hypertensive heart disease with heart failure: Secondary | ICD-10-CM | POA: Insufficient documentation

## 2015-12-21 DIAGNOSIS — E785 Hyperlipidemia, unspecified: Secondary | ICD-10-CM | POA: Insufficient documentation

## 2015-12-21 DIAGNOSIS — I70201 Unspecified atherosclerosis of native arteries of extremities, right leg: Secondary | ICD-10-CM | POA: Diagnosis not present

## 2015-12-21 DIAGNOSIS — E11319 Type 2 diabetes mellitus with unspecified diabetic retinopathy without macular edema: Secondary | ICD-10-CM | POA: Diagnosis not present

## 2015-12-21 DIAGNOSIS — Z48812 Encounter for surgical aftercare following surgery on the circulatory system: Secondary | ICD-10-CM

## 2015-12-21 DIAGNOSIS — I739 Peripheral vascular disease, unspecified: Secondary | ICD-10-CM

## 2015-12-21 DIAGNOSIS — T82856A Stenosis of peripheral vascular stent, initial encounter: Secondary | ICD-10-CM | POA: Insufficient documentation

## 2015-12-21 DIAGNOSIS — I509 Heart failure, unspecified: Secondary | ICD-10-CM | POA: Diagnosis not present

## 2015-12-21 DIAGNOSIS — D119 Benign neoplasm of major salivary gland, unspecified: Secondary | ICD-10-CM | POA: Diagnosis not present

## 2015-12-21 DIAGNOSIS — R938 Abnormal findings on diagnostic imaging of other specified body structures: Secondary | ICD-10-CM | POA: Diagnosis not present

## 2015-12-21 NOTE — Progress Notes (Signed)
[facility] [place] HISTORY AND PHYSICAL    HC:[id number] up left leg bypass Referring Physician:James Maxwell Caul, MD  History of Present Illness:  Patient is a 70 y.o. 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 This bypass was done in 2008 by Dr. Amedeo Plenty. This was done with a vein graft. He previously had vein harvested and radial artery harvested for coronary bypass. He occasionally has some right calf cramps but these did not really somewhat claudication. Of note he has had a previous right superficial femoral artery stent by Dr. Irish Lack also in 2008. Patient also has a history of atrial fibrillation. He is on Coumadin. Other chronic medical problems include hypertension, hyperlipidemia, coronary artery disease, diabetes all of which are currently stable.  He recently had a dog bite on the left leg and was able to heal these wounds spontaneously.       Past Medical History   Diagnosis  Date   .  Reflux     .  Leg pain  03/17/2009       With walking   .  Chest pain     .  Hypertension     .  Hyperlipidemia     .  Myocardial infarction     .  CHF (congestive heart failure)     .  Diabetes mellitus  Age 79   .  Popliteal aneurysm         repaired left side Dr Amedeo Plenty 2008   .  PAD (peripheral artery disease)         Right SFA stent   .  Hx of CABG  1993   .  Ischemic cardiomyopathy     .  Afib     .  Retinopathy     .  PVD (peripheral vascular disease)     .  Tubular adenoma     .  Arthritis         in back   .  Heart attack     .  Weight loss, unintentional     .  Trouble swallowing     .  Change in voice     .  Cough     .  Wheezing     .  Abdominal pain     .  Weakness     .  Generalized headaches     .  Rash     .  Bruises easily     .  Diarrhea     .  Leg swelling     .  Hyperthyroidism     .  Coronary artery disease     .  Fungal infection of toenail  10-2014       left great  toe       Past Surgical History   Procedure  Laterality  Date   .  Back surgery    1994       L5 discectomy   .  Cataract extraction, bilateral       .  Coronary artery bypass graft    1993-2001       X2   .  Pr vein bypass graft,aorto-fem-pop    2008     ROS: _0  Positive   _1  Negative   Valu.Nieves ] All sytems reviewed and are negative  General:_2  Weight loss, _3  Fever, _4  chills Neurologic: _5  Dizziness, _6   Blackouts, _0  Seizure _1  Stroke, _2  "Mini stroke", _3  Slurred speech, _4  Temporary blindness;  _5 weakness,  _6  Hoarseness Cardiac: _7  Chest pain/pressure, _8  Shortness of breath at rest _9  Shortness of breath with exertion,  _10   Atrial fibrillation or irregular heartbeat Vascular:_11  Pain in legs with walking, _12  Pain in legs at rest ,_13  Pain in legs at night,  _14   Non-healing ulcer, _15  Blood clot in vein/DVT,    Pulmonary: _16  Home oxygen, _17   Productive cough, _18  Coughing up blood,  _19  Asthma,  _20  Wheezing Musculoskeletal:  _21  Arthritis, _22  Low back pain,  _23  Joint pain Hematologic:_24  Easy Bruising, _25  Anemia; _26  Hepatitis Gastrointestinal: _27  Blood in stool,  _28  Gastroesophageal Reflux, _29  Trouble swallowing Urinary: _30  chronic Kidney disease, _31  on HD - _32  MWF or _33  TTHS, _34  Burning with urination, _35  Frequent urination, _36  Difficulty urinating;   Skin: _37  Rashes, _38  Wounds Psychological: _39  Anxiety,  _40  Depression  Social History History    Substance Use Topics    .   Smoking status:   Former Smoker          Types:   Cigarettes          Quit date:   09/03/1983    .   Smokeless tobacco:   Not on file    .   Alcohol Use:   No      Family History Family History    Problem   Relation   Age of Onset    .   Heart disease   Other         Allergies    Allergies    Allergen   Reactions    .   Penicillins        Current Outpatient Prescriptions on File Prior to Visit  Medication Sig Dispense Refill  . acetaminophen (TYLENOL)  500 MG tablet Take 1,000 mg by mouth 2 (two) times daily as needed for pain (pain/headache).    Marland Kitchen atorvastatin (LIPITOR) 40 MG tablet Take 40 mg by mouth daily.      . Blood Glucose Monitoring Suppl (ACCU-CHEK AVIVA PLUS) W/DEVICE KIT     . carvedilol (COREG) 25 MG tablet Take 25 mg by mouth 2 (two) times daily with a meal.      . clindamycin (CLEOCIN) 150 MG capsule Take 1 capsule (150 mg total) by mouth 4 (four) times daily. 40 capsule 0  . clopidogrel (PLAVIX) 75 MG tablet Take 75 mg by mouth daily.      . furosemide (LASIX) 80 MG tablet Take 120 mg by mouth daily.     . isosorbide dinitrate (ISOCHRON) 40 MG CR tablet Take 40 mg by mouth every 12 (twelve) hours.    . metFORMIN (GLUCOPHAGE) 500 MG tablet Take 500 mg by mouth 2 (two) times daily with a meal.    . metroNIDAZOLE (METROGEL) 1 % gel Apply 1 application topically daily as needed (rosacia).     Marland Kitchen NIFEdipine (PROCARDIA XL/ADALAT-CC) 90 MG 24 hr tablet Take 1 tablet by mouth once a day 90 tablet 2  . nitroGLYCERIN (NITROSTAT) 0.4 MG SL tablet Place 0.4 mg under the tongue every 5 (five) minutes as needed for chest pain.     . potassium chloride SA (K-DUR,KLOR-CON) 20 MEQ  tablet Take 20 mEq by mouth daily.    Marland Kitchen telmisartan (MICARDIS) 80 MG tablet Take 80 mg by mouth daily.      Marland Kitchen warfarin (COUMADIN) 5 MG tablet Take 5-7.5 mg by mouth daily. 7.5 mg daily Monday through Friday and 45m on Saturday and Sunday     No current facility-administered medications on file prior to visit.    Physical Examination       Filed Vitals:   12/21/15 1111  BP: 136/74  Pulse: 50  Temp: 97 F (36.1 C)  TempSrc: Oral  Resp: 14  Height: _0  (1.778 m)  Weight: 246 lb (111.585 kg)  SpO2: 100%    General:  Alert and oriented, no acute distress HEENT: Normal Neck: No bruit or JVD Pulmonary: Clear to auscultation bilaterally Cardiac: Regular Rate and Rhythm without murmur Gastrointestinal: Soft, non-tender, non-distended, no mass, no  scars Skin: No rash Extremity Pulses:  2+ radial, brachial, femoral, absent dorsalis pedis pulses bilaterally Musculoskeletal: No deformity or edema     Neurologic: Upper and lower extremity motor 5/5 and symmetric  DATA: Noninvasive vascular exam shows a patent left lower extremity above-knee to below-knee popliteal bypass with no increased velocities.  In the right lower extremity the right superficial femoral artery stent is patent however velocities are increased to 410 cm/s proximal aspect. Waveforms were biphasic in the right leg triphasic in the left leg vessels were calcified noncompressible for ABIs.  ASSESSMENT:   Doing well status post repair of left popliteal aneurysm. The patient had a CT scan of abdomen and pelvis in 2007 which showed no abdominal aortic aneurysm. He currently is asymptomatic.  Right superficial femoral artery stent. Does not really have symptoms of claudication but has a duplex suggested of greater than 50% stenosis on the right side. Prior arteriogram for approximate 6 months ago showed exophytic plaque in the common femoral and proximal superficial femoral artery which would not really be amenable to percutaneous treatment without femoral endarterectomy.   PLAN: Since the patient currently is not having symptoms in the right leg and most likely would require femoral endarterectomy and atherectomy. We will continue to follow him for now. If his symptoms worsen over time we would proceed. Patient will follow-up with me with repeat ABIs in 6 months.  CRuta Hinds MD [facility]: [phone]: [phone]

## 2016-01-26 NOTE — Addendum Note (Signed)
Addended by: Mena Goes on: 01/26/2016 01:15 PM   Modules accepted: Orders

## 2016-06-25 ENCOUNTER — Encounter: Payer: Self-pay | Admitting: Vascular Surgery

## 2016-06-27 ENCOUNTER — Ambulatory Visit (HOSPITAL_COMMUNITY)
Admission: RE | Admit: 2016-06-27 | Discharge: 2016-06-27 | Disposition: A | Discharge status: Home / Self Care | Payer: Medicare Other | Source: Ambulatory Visit | Attending: Vascular Surgery | Admitting: Vascular Surgery

## 2016-06-27 ENCOUNTER — Encounter: Payer: Self-pay | Admitting: Vascular Surgery

## 2016-06-27 ENCOUNTER — Ambulatory Visit (INDEPENDENT_AMBULATORY_CARE_PROVIDER_SITE_OTHER): Payer: Medicare Other | Admitting: Vascular Surgery

## 2016-06-27 VITALS — BP 138/67 | HR 55 | Temp 98.4°F | Resp 18 | Ht 70.0 in | Wt 252.0 lb

## 2016-06-27 DIAGNOSIS — I739 Peripheral vascular disease, unspecified: Secondary | ICD-10-CM

## 2016-06-27 NOTE — Progress Notes (Signed)
HPI: [name] is a 70 y.o. 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 This bypass was done in 2008 by Dr. Amedeo Plenty. This was done with a vein graft. He previously had vein harvested and radial artery harvested for coronary bypass.   He has had a previous right superficial femoral artery stent by Dr. Irish Lack also in 2008.    Patient also has a history of atrial fibrillation. He is on Coumadin. Other chronic medical problems include hypertension, hyperlipidemia, coronary artery disease, diabetes all of which are currently stable.       Past Medical History:  Diagnosis Date  . Abdominal pain   . Afib ([place])   . Arthritis    in back  . Bruises easily   . Change in voice   . Chest pain   . CHF (congestive heart failure) ([place])   . Coronary artery disease   . Cough   . Diabetes mellitus Age 70  . Diarrhea   . Fungal infection of toenail 10-2014   left great toe  . Generalized headaches   . Heart attack   . Hx of CABG 1993  . Hyperlipidemia   . Hypertension   . Hyperthyroidism   . Ischemic cardiomyopathy   . Leg pain 03/17/2009   With walking  . Leg swelling   . Myocardial infarction   . PAD (peripheral artery disease) (H[facility])    Right SFA stent  . 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 PVD (peripheral vascular disease) ([place])   . Rash   . Reflux   . Retinopathy   . Trouble swallowing   . Tubular adenoma   . Weakness   . Weight loss, unintentional   . Wheezing    Past Surgical History:  Procedure Laterality Date  . ABDOMINAL AORTAGRAM N/A 11/11/2014   Procedure: ABDOMINAL Maxcine Ham;  Surgeon: Elam Dutch, MD;  Location: [facility] CATH LAB;  Service: Cardiovascular;  Laterality: N/A;  . BACK SURGERY  1994   L5 discectomy  . CATARACT EXTRACTION, BILATERAL    . CORONARY ARTERY BYPASS GRAFT  1993-2001   X2  . PR VEIN BYPASS GRAFT,AORTO-FEM-POP  2008    Family History    Problem Relation Age of Onset  . Heart disease Other   . Cancer Father     stomach  . Diabetes Father   . Hypertension Father   . Cancer Mother     ovarian  . Diabetes Mother   . Heart disease Mother     Heart Disease before age 25  . Hyperlipidemia Mother   . Hypertension Mother   . Cancer Maternal Aunt     breast  . Cancer Brother     Lyphoma  . Hyperlipidemia Brother   . Deep vein thrombosis Brother   . Diabetes Daughter     Amputation  . Heart disease Daughter     Heart Disease before age 40  . Hyperlipidemia Daughter   . Hypertension Daughter   . Peripheral vascular disease Daughter   . Hypertension Brother     SOCIAL HISTORY: Social History   Social History  . Marital status: Married    Spouse name: N/A  . Number of children: N/A  . Years of education: N/A   Occupational History  . Not on file.   Social History Main Topics  . Smoking status: Former Smoker  Types: Cigarettes    Quit date: 09/03/1983  . Smokeless tobacco: Never Used  . Alcohol use No  . Drug use: No  . Sexual activity: Not on file   Other Topics Concern  . Not on file   Social History Narrative  . No narrative on file    Allergies  Allergen Reactions  . Amiodarone Other (See Comments)    Thyroid disfunction  . Erythromycin     Patient unsure of reaction, was told he was allergic.  Marland Kitchen Penicillins Rash    Happened in childhood.    Current Outpatient Prescriptions  Medication Sig Dispense Refill  . acetaminophen (TYLENOL) 500 MG tablet Take 1,000 mg by mouth 2 (two) times daily as needed for pain (pain/headache).    Marland Kitchen atorvastatin (LIPITOR) 40 MG tablet Take 40 mg by mouth daily.      . Blood Glucose Monitoring Suppl (ACCU-CHEK AVIVA PLUS) W/DEVICE KIT     . carvedilol (COREG) 25 MG tablet Take 25 mg by mouth 2 (two) times daily with a meal.      . clopidogrel (PLAVIX) 75 MG tablet Take 75 mg by mouth daily.      . furosemide (LASIX) 80 MG tablet Take 120 mg by mouth daily.      . isosorbide dinitrate (ISOCHRON) 40 MG CR tablet Take 40 mg by mouth every 12 (twelve) hours.    . metFORMIN (GLUCOPHAGE) 500 MG tablet Take 500 mg by mouth 2 (two) times daily with a meal.    . metroNIDAZOLE (METROGEL) 1 % gel Apply 1 application topically daily as needed (rosacia).     Marland Kitchen NIFEdipine (PROCARDIA XL/ADALAT-CC) 90 MG 24 hr tablet Take 1 tablet by mouth once a day 90 tablet 2  . nitroGLYCERIN (NITROSTAT) 0.4 MG SL tablet Place 0.4 mg under the tongue every 5 (five) minutes as needed for chest pain.     . potassium chloride SA (K-DUR,KLOR-CON) 20 MEQ tablet Take 20 mEq by mouth daily.    Marland Kitchen telmisartan (MICARDIS) 80 MG tablet Take 80 mg by mouth daily.      Marland Kitchen warfarin (COUMADIN) 5 MG tablet Take 5-7.5 mg by mouth daily. 7.5 mg daily Monday through Friday and 34m on Saturday and Sunday    . clindamycin (CLEOCIN) 150 MG capsule Take 1 capsule (150 mg total) by mouth 4 (four) times daily. (Patient not taking: Reported on 06/27/2016) 40 capsule 0   No current facility-administered medications for this visit.     ROS:   General:  No weight loss, Fever, chills  HEENT: No recent headaches, no nasal bleeding, no visual changes, no sore throat  Neurologic: No dizziness, blackouts, seizures. No recent symptoms of stroke or mini- stroke. No recent episodes of slurred speech, or temporary blindness.  Cardiac: No recent episodes of chest pain/pressure, no shortness of breath at rest.  No shortness of breath with exertion.  Denies history of atrial fibrillation or irregular heartbeat  Vascular: No history of rest pain in feet.  No history of claudication.  No history of non-healing ulcer, No history of DVT   Pulmonary: No home oxygen, no productive cough, no hemoptysis,  No asthma or wheezing  Musculoskeletal:  _0  Arthritis, _1  Low back pain,  _2  Joint pain  Hematologic:No history of hypercoagulable state.  No history of easy bleeding.  No history of anemia  Gastrointestinal: No  hematochezia or melena,  No gastroesophageal reflux, no trouble swallowing  Urinary: _3  chronic Kidney disease, _4  on  HD - _0  MWF or _1  TTHS, _2  Burning with urination, _3  Frequent urination, _4  Difficulty urinating;   Skin: No rashes  Psychological: No history of anxiety,  No history of depression   Physical Examination  Vitals:   06/27/16 1521  BP: 138/67  Pulse: (!) 55  Resp: 18  Temp: 98.4 F (36.9 C)  SpO2: 98%  Weight: 252 lb (114.3 kg)  Height: _5  (1.778 m)    Body mass index is 36.16 kg/m.  General:  Alert and oriented, no acute distress HEENT: Normal Neck: No bruit or JVD Pulmonary: Clear to auscultation bilaterally Cardiac: Regular Rate and Rhythm without murmur Abdomen: Soft, non-tender, non-distended, no mass, no scars Skin: No rash Extremity Pulses:  2+ radial, brachial, femoral, dorsalis pedis bilaterally Musculoskeletal: No deformity or minimal edema Bilateral ankle edema Neurologic: Upper and lower extremity motor 5/5 and symmetric  DATA:  ABI's Right 1.19 triphasic flow Left 1.05 triphasic flow  ASSESSMENT:   Status post repair of left popliteal aneurysm Right superficial femoral artery stent.  He is asymptomatic without complaints of claudication.  Duplex suggested of greater than 50% stenosis on the right side not confirmed on recent arteriogram   PLAN:   Asymptomatic iliac stent stenosis right LE for claudication.  We will have him continue activities  as tolerates.  He will f/u in 1 year for repeat ABI's   COLLINS, EMMA MAUREEN PA-C [facility]  He was seen in conjunction with Dr. Oneida Alar today  History and exam findings as above. Patient with known peripheral arterial disease. Currently asymptomatic. He has reasonable ABIs on both sides. He will follow-up in one year or sooner if he develops any recurrent symptoms of claudication.  Ruta Hinds, MD [facility] Office: [phone] Pager: [phone]

## 2016-07-23 NOTE — Addendum Note (Signed)
Addended by: Lianne Cure A on: 07/23/2016 03:10 PM   Modules accepted: Orders

## 2017-07-10 ENCOUNTER — Ambulatory Visit: Payer: Medicare Other | Admitting: Vascular Surgery

## 2017-07-10 ENCOUNTER — Encounter (HOSPITAL_COMMUNITY): Payer: Medicare Other

## 2017-07-28 NOTE — Progress Notes (Signed)
Cardiology Office Note    Date:  07/29/2017   ID:  [name], DOB [birth year], MRN [id number]  PCP:  Wenda Low, MD  Cardiologist: Sinclair Grooms, MD   Chief Complaint  Patient presents with  . Coronary Artery Disease  . Congestive Heart Failure    History of Present Illness:  [name] is a 71 y.o. 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   I have not seen [name] in over 3 years.  He has predominantly at the chore of caring for his wife who is now on dialysis.  He has had a little time to be able to make office appointments for his own cardiovascular problems.  He has stable mildly limiting exertional dyspnea.  He has rare angina.  He rarely uses sublingual nitroglycerin.  He denies orthopnea, PND, palpitations, syncope, edema.  Overall he feels well acknowledges that he is stable from his viewpoint.  Past Medical History:  Diagnosis Date  . Abdominal pain   . Afib ([place])   . Arthritis    in back  . Bruises easily   . Change in voice   . Chest pain   . CHF (congestive heart failure) ([place])   . Coronary artery disease   . Cough   . Diabetes mellitus Age 67  . Diarrhea   . Fungal infection of toenail 10-2014   left great toe  . Generalized headaches   . Heart attack ([place])   . Hx of CABG 1993  . Hyperlipidemia   . Hypertension   . Hyperthyroidism   . Ischemic cardiomyopathy   . Leg pain 03/17/2009   With walking  . Leg swelling   . Myocardial infarction ([place])   . PAD (peripheral artery disease) (H[facility])    Right SFA stent  . 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 PVD (peripheral vascular disease) ([place])   . Rash   . Reflux   . Retinopathy   . Trouble swallowing   . Tubular adenoma   . Weakness   . Weight loss, unintentional   . Wheezing     Past Surgical History:  Procedure Laterality Date  . ABDOMINAL AORTAGRAM N/A 11/11/2014     Procedure: ABDOMINAL Maxcine Ham;  Surgeon: Elam Dutch, MD;  Location[facility] CATH LAB;  Service: Cardiovascular;  Laterality: N/A;  . BACK SURGERY  1994   L5 discectomy  . CATARACT EXTRACTION, BILATERAL    . CORONARY ARTERY BYPASS GRAFT  1993-2001   X2  . PR VEIN BYPASS GRAFT,AORTO-FEM-POP  2008    Current Medications: Outpatient Medications Prior to Visit  Medication Sig Dispense Refill  . acetaminophen (TYLENOL) 500 MG tablet Take 1,000 mg by mouth 2 (two) times daily as needed for pain (pain/headache).    Marland Kitchen atorvastatin (LIPITOR) 40 MG tablet Take 40 mg by mouth daily.      . Blood Glucose Monitoring Suppl (ACCU-CHEK AVIVA PLUS) W/DEVICE KIT     . carvedilol (COREG) 25 MG tablet Take 25 mg by mouth 2 (two) times daily with a meal.      . clopidogrel (PLAVIX) 75 MG tablet Take 75 mg by mouth daily.      . furosemide (LASIX) 80 MG tablet Take 120 mg by mouth daily.     . metFORMIN (GLUCOPHAGE) 500 MG tablet Take 500 mg by mouth 2 (two) times daily with a meal.    .  metroNIDAZOLE (METROGEL) 1 % gel Apply 1 application topically daily as needed (rosacia).     Marland Kitchen NIFEdipine (PROCARDIA XL/ADALAT-CC) 90 MG 24 hr tablet Take 1 tablet by mouth once a day 90 tablet 2  . nitroGLYCERIN (NITROSTAT) 0.4 MG SL tablet Place 0.4 mg under the tongue every 5 (five) minutes as needed for chest pain.     . potassium chloride SA (K-DUR,KLOR-CON) 20 MEQ tablet Take 20 mEq by mouth daily.    Marland Kitchen telmisartan (MICARDIS) 80 MG tablet Take 80 mg by mouth daily.      Marland Kitchen warfarin (COUMADIN) 5 MG tablet Take 5-7.5 mg by mouth daily. 7.5 mg daily Monday through Friday and 38m on Saturday and Sunday    . clindamycin (CLEOCIN) 150 MG capsule Take 1 capsule (150 mg total) by mouth 4 (four) times daily. (Patient not taking: Reported on 06/27/2016) 40 capsule 0  . isosorbide dinitrate (ISOCHRON) 40 MG CR tablet Take 40 mg by mouth every 12 (twelve) hours.     No facility-administered medications prior to visit.       Allergies:   Amiodarone; Erythromycin; and Penicillins   Social History   Socioeconomic History  . Marital status: Married    Spouse name: None  . Number of children: None  . Years of education: None  . Highest education level: None  Social Needs  . Financial resource strain: None  . Food insecurity - worry: None  . Food insecurity - inability: None  . Transportation needs - medical: None  . Transportation needs - non-medical: None  Occupational History  . None  Tobacco Use  . Smoking status: Former Smoker    Types: Cigarettes    Last attempt to quit: 09/03/1983    Years since quitting: 33.9  . Smokeless tobacco: Never Used  Substance and Sexual Activity  . Alcohol use: No  . Drug use: No  . Sexual activity: None  Other Topics Concern  . None  Social History Narrative  . None     Family History:  The patient's family history includes Cancer in his brother, father, maternal aunt, and mother; Deep vein thrombosis in his brother; Diabetes in his daughter, father, and mother; Heart disease in his daughter, mother, and other; Hyperlipidemia in his brother, daughter, and mother; Hypertension in his brother, daughter, father, and mother; Peripheral vascular disease in his daughter.   ROS:   Please see the history of present illness.    Has had some lower extremity swelling different than usual.  Bilateral leg pain, back pain, occasional palpitation. All other systems reviewed and are negative.   PHYSICAL EXAM:   VS:  BP 110/64   Pulse (!) 49   Ht _0  (1.778 m)   Wt 257 lb 12.8 oz (116.9 kg)   BMI 36.99 kg/m    GEN: Well nourished, well developed, in no acute distress 2.  Morbidly obese. HEENT: normal  Neck: no JVD, carotid bruits, or masses Cardiac: RRR; no murmurs, rubs, or gallops,no edema  Respiratory:  clear to auscultation bilaterally, normal work of breathing GI: soft, nontender, nondistended, + BS MS: no deformity or atrophy  Skin: warm and dry, no  rash Neuro:  Alert and Oriented x 3, Strength and sensation are intact Psych: euthymic mood, full affect  Wt Readings from Last 3 Encounters:  07/29/17 257 lb 12.8 oz (116.9 kg)  06/27/16 252 lb (114.3 kg)  12/21/15 246 lb (111.6 kg)      Studies/Labs Reviewed:   EKG:  EKG  sinus bradycardia, 49 bpm.  Normal PR interval.  Left axis deviation.  Poor R wave progression V1 through V4 with QS pattern V1 and V2.  When compared to prior tracings, no significant change  Recent Labs: No results found for requested labs within last 8760 hours.   Lipid Panel No results found for: CHOL, TRIG, HDL, CHOLHDL, VLDL, LDLCALC, LDLDIRECT  Additional studies/ records that were reviewed today include:  No new or recent functional data.  The most recent echo from 2014 demonstrates EF of 40-45%.  Mild reduction in right ankle-brachial index from 2017.  ASSESSMENT:    1. Chronic systolic HF (heart failure) ([place])   2. Coronary artery disease involving coronary bypass graft of native heart without angina pectoris   3. Atherosclerotic peripheral vascular disease with intermittent claudication (HCC)   4. Paroxysmal atrial fibrillation ([place])   5. Essential hypertension   6. Chronic anticoagulation      PLAN:  In order of problems listed above:  1. Chronic systolic heart failure with no recent assessment of LV function.  There is no evidence of volume overload.  The patient is stable. 2. Stable angina pectoris.  Nitroglycerin tablets will be refilled. 3. Last vascular ultrasound study of the lower extremities was done in 2017 and demonstrated. 4. No symptomatic episodes of atrial fibrillation. 5. Blood pressures under excellent control. 6. No bleeding on current anticoagulation regimen.  He continues on Coumadin and INR is followed at equal.  Clinical follow-up in 1 year.  No imaging or functional testing is necessary at this time.  Medication Adjustments/Labs and Tests Ordered: Current medicines  are reviewed at length with the patient today.  Concerns regarding medicines are outlined above.  Medication changes, Labs and Tests ordered today are listed in the Patient Instructions below. Patient Instructions  Medication Instructions:  Your physician recommends that you continue on your current medications as directed. Please refer to the Current Medication list given to you today.  Labwork: None  Testing/Procedures: None  Follow-Up: Your physician wants you to follow-up in: 1 year with Dr. Tamala Julian.  You will receive a reminder letter in the mail two months in advance. If you don't receive a letter, please call our office to schedule the follow-up appointment.   Any Other Special Instructions Will Be Listed Below (If Applicable).     If you need a refill on your cardiac medications before your next appointment, please call your pharmacy.      Signed, Sinclair Grooms, MD  07/29/2017 1:04 PM    [facility] [address], [place]  [address] Phone: [phone]; Fax: [phone]

## 2017-07-29 ENCOUNTER — Encounter (INDEPENDENT_AMBULATORY_CARE_PROVIDER_SITE_OTHER): Payer: Self-pay

## 2017-07-29 ENCOUNTER — Encounter: Payer: Self-pay | Admitting: Interventional Cardiology

## 2017-07-29 ENCOUNTER — Ambulatory Visit: Payer: Medicare Other | Admitting: Interventional Cardiology

## 2017-07-29 VITALS — BP 110/64 | HR 49 | Ht 70.0 in | Wt 257.8 lb

## 2017-07-29 DIAGNOSIS — Z7901 Long term (current) use of anticoagulants: Secondary | ICD-10-CM

## 2017-07-29 DIAGNOSIS — I480 Paroxysmal atrial fibrillation: Secondary | ICD-10-CM

## 2017-07-29 DIAGNOSIS — I5022 Chronic systolic (congestive) heart failure: Secondary | ICD-10-CM | POA: Diagnosis not present

## 2017-07-29 DIAGNOSIS — I25810 Atherosclerosis of coronary artery bypass graft(s) without angina pectoris: Secondary | ICD-10-CM | POA: Diagnosis not present

## 2017-07-29 DIAGNOSIS — I10 Essential (primary) hypertension: Secondary | ICD-10-CM

## 2017-07-29 DIAGNOSIS — I70219 Atherosclerosis of native arteries of extremities with intermittent claudication, unspecified extremity: Secondary | ICD-10-CM

## 2017-07-29 NOTE — Patient Instructions (Signed)

## 2017-09-11 ENCOUNTER — Encounter: Payer: Self-pay | Admitting: Vascular Surgery

## 2017-09-11 ENCOUNTER — Ambulatory Visit: Payer: Medicare Other | Admitting: Vascular Surgery

## 2017-09-11 ENCOUNTER — Ambulatory Visit (HOSPITAL_COMMUNITY)
Admission: RE | Admit: 2017-09-11 | Discharge: 2017-09-11 | Disposition: A | Discharge status: Home / Self Care | Payer: Medicare Other | Source: Ambulatory Visit | Attending: Vascular Surgery | Admitting: Vascular Surgery

## 2017-09-11 VITALS — BP 160/73 | HR 62 | Temp 98.7°F | Resp 16 | Ht 70.0 in | Wt 262.0 lb

## 2017-09-11 DIAGNOSIS — R9439 Abnormal result of other cardiovascular function study: Secondary | ICD-10-CM | POA: Diagnosis not present

## 2017-09-11 DIAGNOSIS — I739 Peripheral vascular disease, unspecified: Secondary | ICD-10-CM

## 2017-09-11 NOTE — Progress Notes (Signed)
History of Present Illness:  Patient is a 72 y.o. year old male who presents for evaluation of bilateral lower extremities with PAD.  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 Amedeo Plenty and right superficial femoral artery stent by Dr. Irish Lack also in 2008.   The patient currently describes no cramping sensation or other symptoms of claudication when active.  There is not rest pain.  There is no history of ulcerations on the feet.  Atherosclerotic risk factors and other medical problems include DM, HTN, hyperlipidemia, CAD,and A fib managed with Coumadin.    Past Medical History:  Diagnosis Date  . Abdominal pain   . Afib ([place])   . Arthritis    in back  . Bruises easily   . Change in voice   . Chest pain   . CHF (congestive heart failure) ([place])   . Coronary artery disease   . Cough   . Diabetes mellitus Age 77  . Diarrhea   . Fungal infection of toenail 10-2014   left great toe  . Generalized headaches   . Heart attack ([place])   . Hx of CABG 1993  . Hyperlipidemia   . Hypertension   . Hyperthyroidism   . Ischemic cardiomyopathy   . Leg pain 03/17/2009   With walking  . Leg swelling   . Myocardial infarction ([place])   . PAD (peripheral artery disease) ([facility])    Right SFA stent  . 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 PVD (peripheral vascular disease) ([place])   . Rash   . Reflux   . Retinopathy   . Trouble swallowing   . Tubular adenoma   . Weakness   . Weight loss, unintentional   . Wheezing     Past Surgical History:  Procedure Laterality Date  . ABDOMINAL AORTAGRAM N/A 11/11/2014   Procedure: ABDOMINAL Maxcine Ham;  Surgeon: Elam Dutch, MD;  Location: [facility] CATH LAB;  Service: Cardiovascular;  Laterality: N/A;  . BACK SURGERY  1994   L5 discectomy  . CATARACT EXTRACTION, BILATERAL    . CORONARY ARTERY BYPASS GRAFT  1993-2001   X2  . PR VEIN BYPASS GRAFT,AORTO-FEM-POP  2008    ROS:    General:  No weight loss, Fever, chills  HEENT: No recent headaches, no nasal bleeding, no visual changes, no sore throat  Neurologic: No dizziness, blackouts, seizures. No recent symptoms of stroke or mini- stroke. No recent episodes of slurred speech, or temporary blindness.  Cardiac: No recent episodes of chest pain/pressure, no shortness of breath at rest.  No shortness of breath with exertion.  Denies history of atrial fibrillation or irregular heartbeat  Vascular: No history of rest pain in feet.  No history of claudication.  No history of non-healing ulcer, No history of DVT   Pulmonary: No home oxygen, no productive cough, no hemoptysis,  No asthma or wheezing  Musculoskeletal:  [ ] Arthritis, [ ] Low back pain,  [ ] Joint pain  Hematologic:No history of hypercoagulable state.  No history of easy bleeding.  No history of anemia  Gastrointestinal: No hematochezia or melena,  No gastroesophageal reflux, no trouble swallowing  Urinary: [ ] chronic Kidney disease, [ ] on HD - [ ] MWF or [ ] TTHS, [ ] Burning with urination, [ ] Frequent urination, [ ] Difficulty urinating;   Skin: No rashes  Psychological: No history of  anxiety,  No history of depression  Social History Social History   Tobacco Use  . Smoking status: Former Smoker    Types: Cigarettes    Last attempt to quit: 09/03/1983    Years since quitting: 34.0  . Smokeless tobacco: Never Used  Substance Use Topics  . Alcohol use: No  . Drug use: No    Family History Family History  Problem Relation Age of Onset  . Heart disease Other   . Cancer Father        stomach  . Diabetes Father   . Hypertension Father   . Cancer Mother        ovarian  . Diabetes Mother   . Heart disease Mother        Heart Disease before age 65  . Hyperlipidemia Mother   . Hypertension Mother   . Cancer Maternal Aunt        breast  . Cancer Brother        Lyphoma  . Hyperlipidemia Brother   . Deep vein thrombosis Brother    . Diabetes Daughter        Amputation  . Heart disease Daughter        Heart Disease before age 26  . Hyperlipidemia Daughter   . Hypertension Daughter   . Peripheral vascular disease Daughter   . Hypertension Brother     Allergies  Allergies  Allergen Reactions  . Amiodarone Other (See Comments)    Thyroid disfunction  . Erythromycin     Patient unsure of reaction, was told he was allergic.  Marland Kitchen Penicillins Rash    Happened in childhood.     Current Outpatient Medications  Medication Sig Dispense Refill  . acetaminophen (TYLENOL) 500 MG tablet Take 1,000 mg by mouth 2 (two) times daily as needed for pain (pain/headache).    Marland Kitchen atorvastatin (LIPITOR) 40 MG tablet Take 40 mg by mouth daily.      . Blood Glucose Monitoring Suppl (ACCU-CHEK AVIVA PLUS) W/DEVICE KIT     . carvedilol (COREG) 25 MG tablet Take 25 mg by mouth 2 (two) times daily with a meal.      . clopidogrel (PLAVIX) 75 MG tablet Take 75 mg by mouth daily.      . furosemide (LASIX) 80 MG tablet Take 120 mg by mouth daily.     . metFORMIN (GLUCOPHAGE) 500 MG tablet Take 500 mg by mouth 2 (two) times daily with a meal.    . metroNIDAZOLE (METROGEL) 1 % gel Apply 1 application topically daily as needed (rosacia).     Marland Kitchen NIFEdipine (PROCARDIA XL/ADALAT-CC) 90 MG 24 hr tablet Take 1 tablet by mouth once a day 90 tablet 2  . nitroGLYCERIN (NITROSTAT) 0.4 MG SL tablet Place 0.4 mg under the tongue every 5 (five) minutes as needed for chest pain.     . potassium chloride SA (K-DUR,KLOR-CON) 20 MEQ tablet Take 20 mEq by mouth daily.    Marland Kitchen telmisartan (MICARDIS) 80 MG tablet Take 80 mg by mouth daily.      Marland Kitchen warfarin (COUMADIN) 5 MG tablet Take 5-7.5 mg by mouth daily. 7.5 mg daily Monday through Friday and 49m on Saturday and Sunday     No current facility-administered medications for this visit.     Physical Examination  Vitals:   09/11/17 1528  BP: (!) 160/73  Pulse: 62  Resp: 16  Temp: 98.7 F (37.1 C)  TempSrc:  Oral  SpO2: 95%  Weight: 262 lb (118.8 kg)  Height: 5' 10" (1.778 m)    Body mass index is 37.59 kg/m.  General:  Alert and oriented, no acute distress HEENT: Normal Neck: No bruit or JVD Pulmonary: Clear to auscultation bilaterally Cardiac: Regular Rate and Rhythm without murmur Abdomen: Soft, non-tender, non-distended, no mass, no scars Skin: No rash Extremity Pulses:  2+ radial, brachial, femoral, B dorsalis pedis, L posterior tibial pulses bilaterally Musculoskeletal: No deformity or edema  Neurologic: Upper and lower extremity motor 5/5 and symmetric  DATA:  ABI's  Right demonstrates non compressible PT/DP likely due to calcified vessels, TBI 0.51 and he has both Triphasic and Biphasic arterial flow Left PT 1.13 and non compressible DP likely due to calcified vessels, TBI 0.Adam with normal Triphasic arterial flow   ASSESSMENT:  PAD s/p Right SFA stent and 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     PLAN:  He continues to be asymptomatic without symptoms of claudication or rest pain.  His ABI's indicate patent By pass and stent.  He will follow up in 1 year for repeat ABI's and by pass duplex studies.  We discussed that if he develops  new symptoms such as claudication, non healing wounds or rest pain he will call us sooner.   [facility]  The patient was seen in conjunction with Dr. Oneida Alar today  History and exam findings as above.  Patient with long-standing history of peripheral arterial disease.  He continues to have patency of his left popliteal aneurysm repair which is a vein graft.  His right SFA stent is patent as well.  He is currently asymptomatic.  He will follow-up in 1 year.  Ruta Hinds, MD [facility] Office: [phone] Pager: [phone]

## 2017-09-29 IMAGING — US US CAROTID DUPLEX BILAT
1 series · 13 of 24 positions shown · non-contrast
Comparison: None.

CLINICAL DATA: Hypertension, former smoker, diabetes,
hyperlipidemia

EXAM:
BILATERAL CAROTID DUPLEX ULTRASOUND
TECHNIQUE: Gray scale imaging, color Doppler and duplex ultrasound were
performed of bilateral carotid and vertebral arteries in the neck.

[Series 1: us carotid duplex bilat · 0.08mm/px · 13 of 56 slices shown]
[im 1/56]
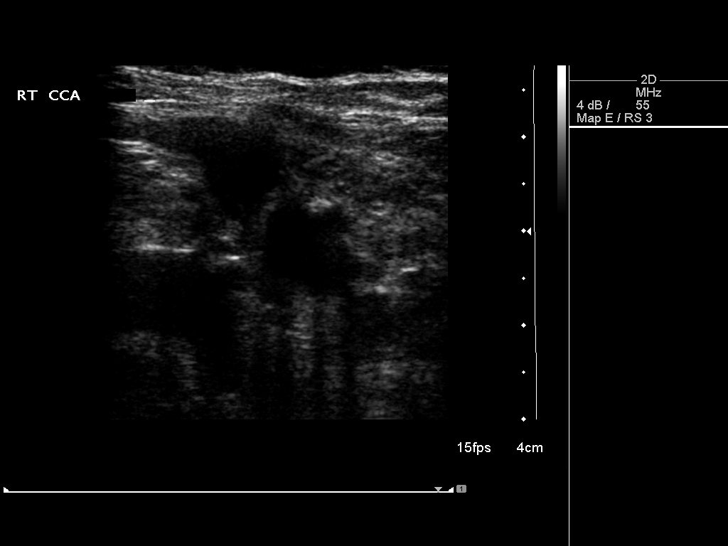
[im 5/56]
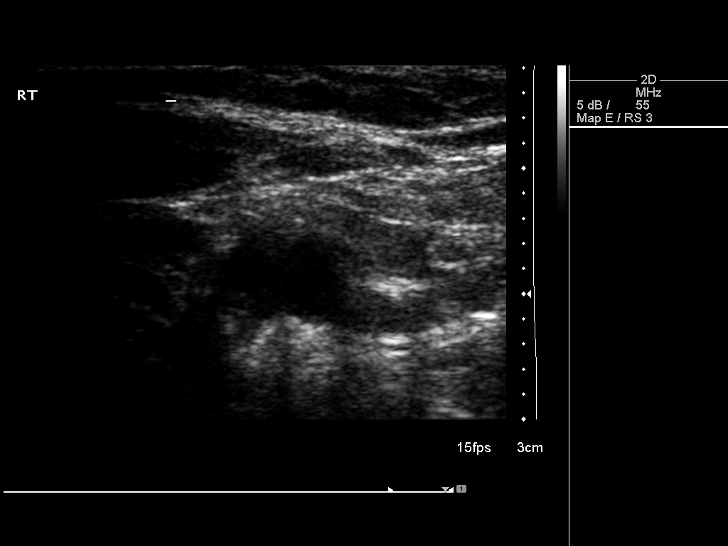
[im 10/56]
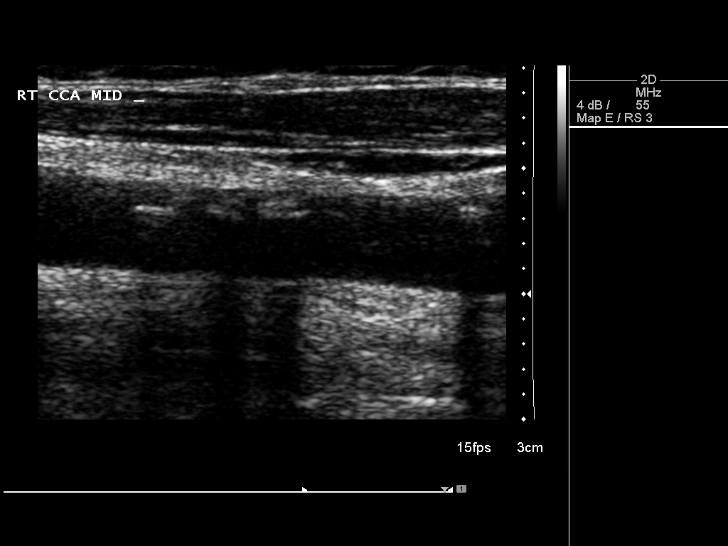
[im 15/56]
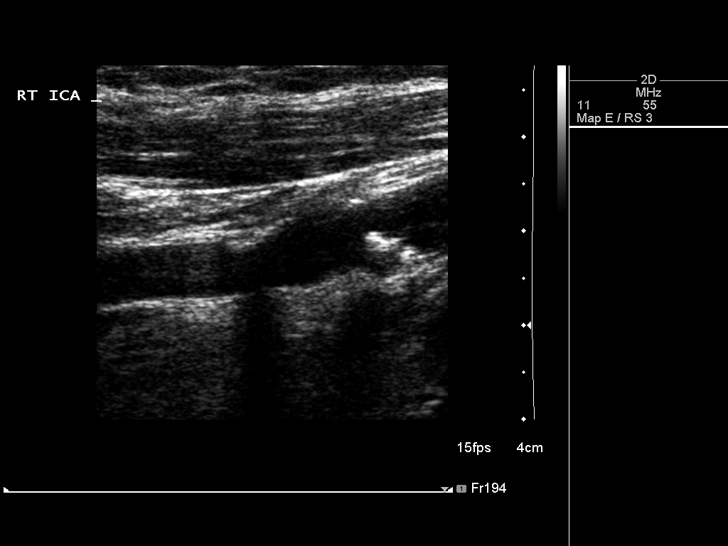
[im 20/56]
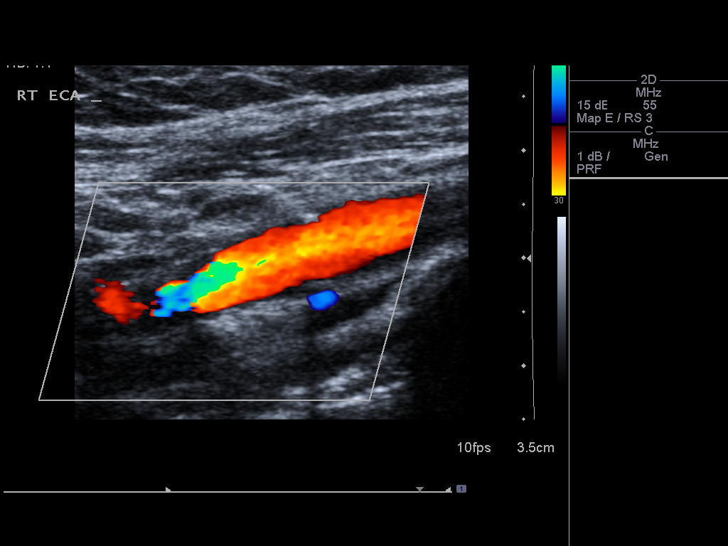
[im 24/56]
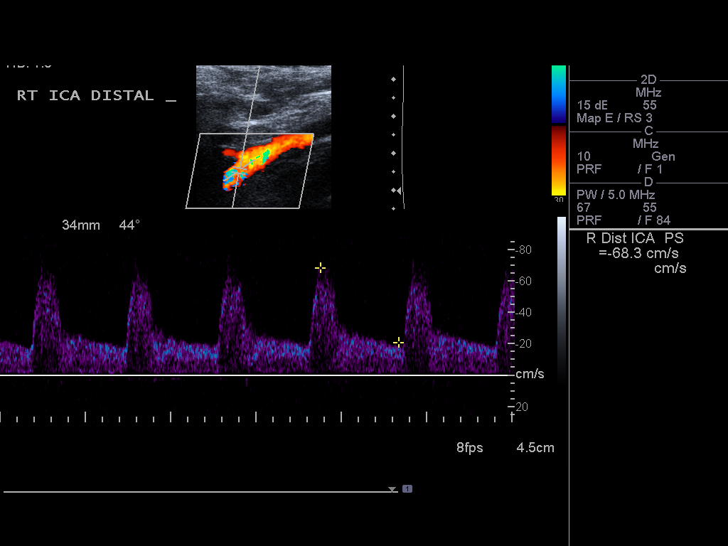
[im 29/56]
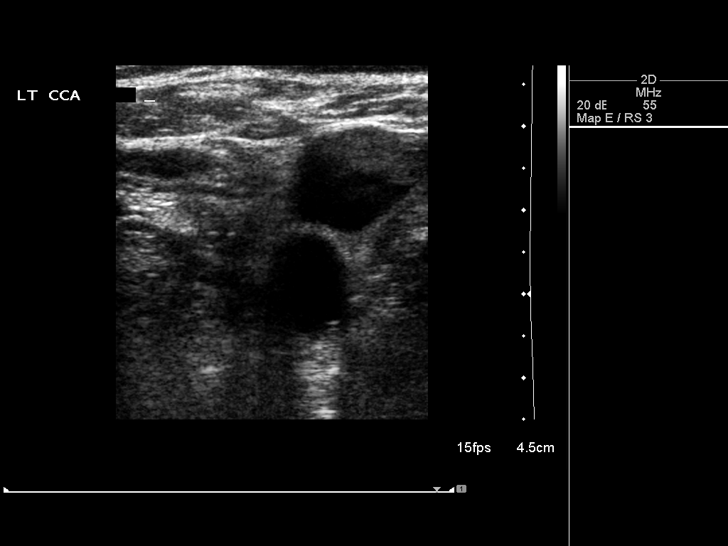
[im 32/56]
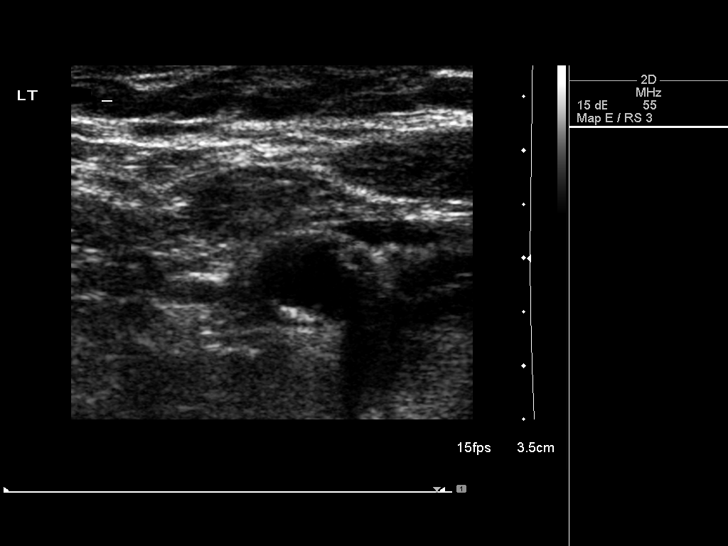
[im 36/56]
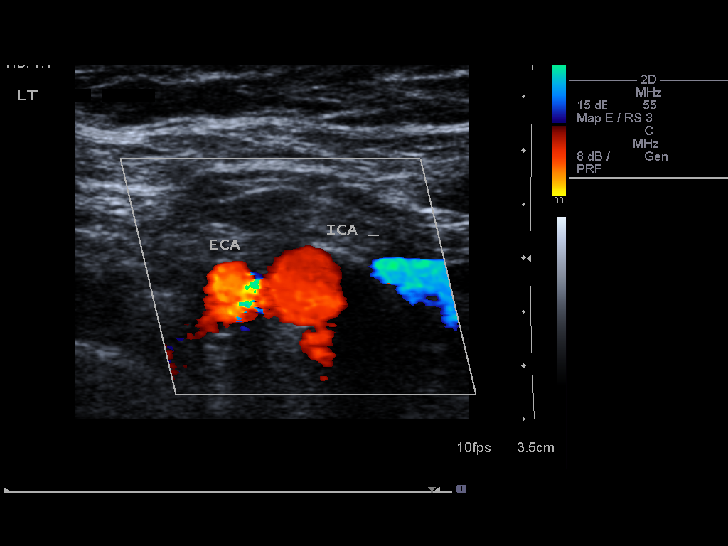
[im 41/56]
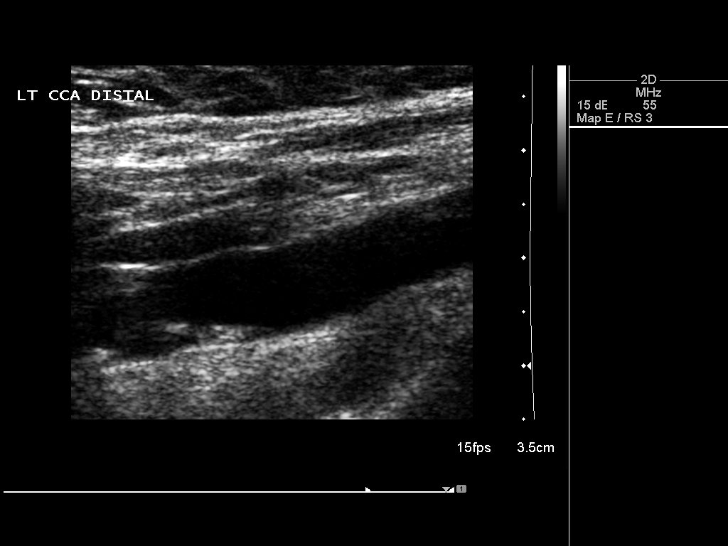
[im 46/56]
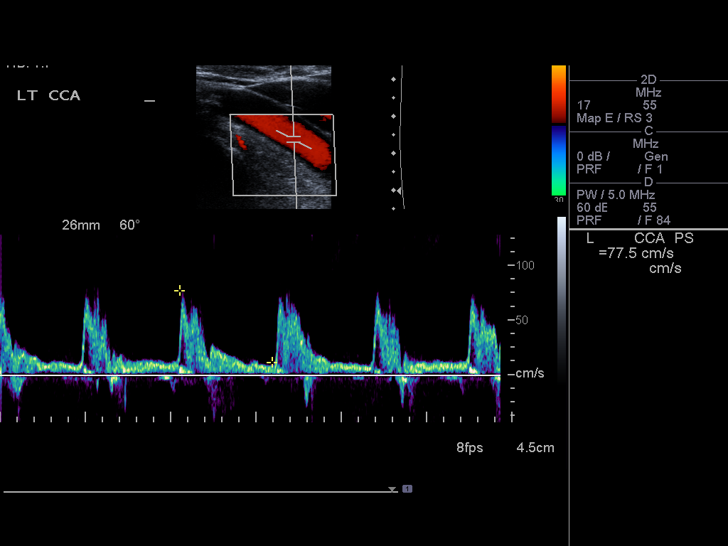
[im 51/56]
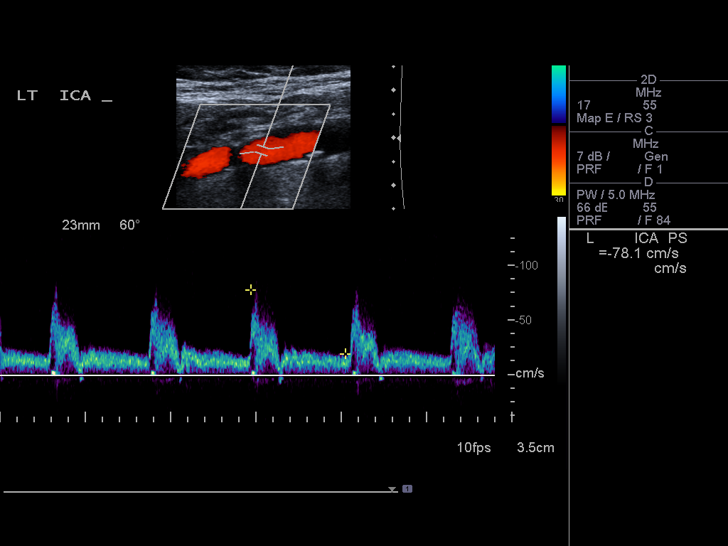
[im 56/56]
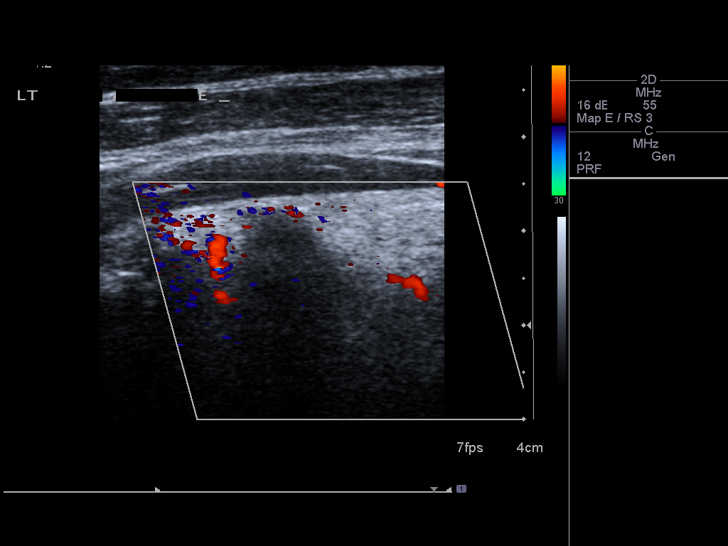

[13 of 24 positions shown; findings below may reference images not displayed]

FINDINGS: Criteria: Quantification of carotid stenosis is based on velocity
parameters that correlate the residual internal carotid diameter
with NASCET-based stenosis levels, using the diameter of the distal
internal carotid lumen as the denominator for stenosis measurement.

The following velocity measurements were obtained:

RIGHT

ICA:  120/29 cm/sec

CCA:  105/19 cm/sec

SYSTOLIC ICA/CCA RATIO:

DIASTOLIC ICA/CCA RATIO:

ECA:  167 cm/sec

LEFT

ICA:  101/17 cm/sec

CCA:  95/16 cm/sec

SYSTOLIC ICA/CCA RATIO:

DIASTOLIC ICA/CCA RATIO:

ECA:  141 cm/sec

RIGHT CAROTID ARTERY: Mild heterogeneous partially calcified plaque
formation. No hemodynamically significant right ICA stenosis,
velocity elevation, or turbulent flow. Degree of narrowing less than
50%.

RIGHT VERTEBRAL ARTERY:  Not visualized, possibly occluded

LEFT CAROTID ARTERY: Similar scattered mild heterogeneous partially
calcified plaque formation. No hemodynamically significant left ICA
stenosis, velocity elevation, or turbulent flow.

LEFT VERTEBRAL ARTERY:  Antegrade
IMPRESSION: Mild heterogeneous atherosclerosis bilaterally. No hemodynamically
significant ICA stenosis. Degree of narrowing less than 50%
bilaterally.

Nonvisualized right vertebral artery, possibly occluded

Patent antegrade left vertebral artery

## 2017-11-13 ENCOUNTER — Other Ambulatory Visit: Payer: Self-pay | Admitting: Internal Medicine

## 2017-11-13 DIAGNOSIS — E042 Nontoxic multinodular goiter: Secondary | ICD-10-CM

## 2017-11-25 ENCOUNTER — Ambulatory Visit
Admission: RE | Admit: 2017-11-25 | Discharge: 2017-11-25 | Disposition: A | Discharge status: Home / Self Care | Payer: Medicare Other | Source: Ambulatory Visit | Attending: Internal Medicine | Admitting: Internal Medicine

## 2017-11-25 DIAGNOSIS — E042 Nontoxic multinodular goiter: Secondary | ICD-10-CM

## 2017-11-27 ENCOUNTER — Other Ambulatory Visit: Payer: Self-pay | Admitting: Internal Medicine

## 2017-11-27 DIAGNOSIS — E041 Nontoxic single thyroid nodule: Secondary | ICD-10-CM

## 2017-12-04 ENCOUNTER — Telehealth: Payer: Self-pay | Admitting: Interventional Cardiology

## 2017-12-04 NOTE — Telephone Encounter (Signed)
New message       [facility] Pre-operative Risk Assessment    Request for surgical clearance:  1. What type of surgery is being performed? Thyroid biopsy  2. When is this surgery scheduled? TBD  3. What type of clearance is required (medical clearance vs. Pharmacy clearance to hold med vs. Both)? Pharmacy  4. Are there any medications that need to be held prior to surgery and how long? Plavix and Coumadin  5. Practice name and name of physician performing surgery? [facility], Dr. Benita Stabile  6. What is your office phone and fax number? Phone [phone], fax [phone]  7. Anesthesia type (None, local, MAC, general) ? local   Laurier Nancy 12/04/2017, 12:33 PM  _________________________________________________________________   (provider comments below)

## 2017-12-05 NOTE — Telephone Encounter (Signed)
   Pt of Dr. Tamala Julian.  He is on both Plavix and Coumadin.  Dr. Tamala Julian to address the Plavix, will route to the pharmacy to address the Coumadin.  Rosaria Ferries, PA-C 12/05/2017 5:32 PM Bee[facility] [phone]

## 2017-12-07 NOTE — Telephone Encounter (Signed)
Patient with diagnosis of atrial fibrillation on warfarin for anticoagulation.    Procedure: thyroid biopsy Date of procedure: TBD  CHADS2-VASc score of  5 (CHF, HTN, AGE, DM2,, CAD, )  CrCl 102.8 Platelet count 115 (NOTE: this is from Feb 2014)  Per office protocol, patient can hold warfarin for 5 days prior to procedure.    Patient will not need bridging with Lovenox (enoxaparin) around procedure.  Patient should restart warfarin within 48 hours, at discretion of procedure MD

## 2017-12-08 NOTE — Telephone Encounter (Signed)
Information faxed to [facility], Dr. Benita Stabile

## 2018-01-08 ENCOUNTER — Inpatient Hospital Stay: Admission: RE | Admit: 2018-01-08 | Payer: Medicare Other | Source: Ambulatory Visit

## 2018-01-22 ENCOUNTER — Other Ambulatory Visit (HOSPITAL_COMMUNITY)
Admission: RE | Admit: 2018-01-22 | Discharge: 2018-01-22 | Disposition: A | Discharge status: Home / Self Care | Payer: Medicare Other | Source: Ambulatory Visit | Attending: Radiology | Admitting: Radiology

## 2018-01-22 ENCOUNTER — Ambulatory Visit
Admission: RE | Admit: 2018-01-22 | Discharge: 2018-01-22 | Disposition: A | Discharge status: Home / Self Care | Payer: Medicare Other | Source: Ambulatory Visit | Attending: Internal Medicine | Admitting: Internal Medicine

## 2018-01-22 DIAGNOSIS — E041 Nontoxic single thyroid nodule: Secondary | ICD-10-CM

## 2018-01-22 DIAGNOSIS — E042 Nontoxic multinodular goiter: Secondary | ICD-10-CM | POA: Diagnosis present

## 2018-01-29 ENCOUNTER — Other Ambulatory Visit: Payer: Self-pay | Admitting: Internal Medicine

## 2018-01-29 DIAGNOSIS — E041 Nontoxic single thyroid nodule: Secondary | ICD-10-CM

## 2018-02-19 ENCOUNTER — Other Ambulatory Visit (HOSPITAL_COMMUNITY)
Admission: RE | Admit: 2018-02-19 | Discharge: 2018-02-19 | Disposition: A | Discharge status: Home / Self Care | Payer: Medicare Other | Source: Ambulatory Visit | Attending: Radiology | Admitting: Radiology

## 2018-02-19 ENCOUNTER — Ambulatory Visit
Admission: RE | Admit: 2018-02-19 | Discharge: 2018-02-19 | Disposition: A | Discharge status: Home / Self Care | Payer: Medicare Other | Source: Ambulatory Visit | Attending: Internal Medicine | Admitting: Internal Medicine

## 2018-02-19 DIAGNOSIS — E041 Nontoxic single thyroid nodule: Secondary | ICD-10-CM

## 2018-02-19 DIAGNOSIS — E042 Nontoxic multinodular goiter: Secondary | ICD-10-CM | POA: Insufficient documentation

## 2018-07-01 ENCOUNTER — Ambulatory Visit
Admission: RE | Admit: 2018-07-01 | Discharge: 2018-07-01 | Disposition: A | Discharge status: Home / Self Care | Payer: Medicare Other | Source: Ambulatory Visit | Attending: Internal Medicine | Admitting: Internal Medicine

## 2018-07-01 ENCOUNTER — Other Ambulatory Visit: Payer: Self-pay | Admitting: Internal Medicine

## 2018-07-01 DIAGNOSIS — R059 Cough, unspecified: Secondary | ICD-10-CM

## 2018-07-01 DIAGNOSIS — R05 Cough: Secondary | ICD-10-CM

## 2018-11-22 IMAGING — US US THYROID
1 series · 12 of 25 positions shown · non-contrast
Comparison: 12/19/2015

CLINICAL DATA: Goiter.

EXAM:
THYROID ULTRASOUND
TECHNIQUE: Ultrasound examination of the thyroid gland and adjacent soft
tissues was performed.

[Series 1: us thyroid · 0.06mm/px · 12 of 57 slices shown]
[im 3/57]
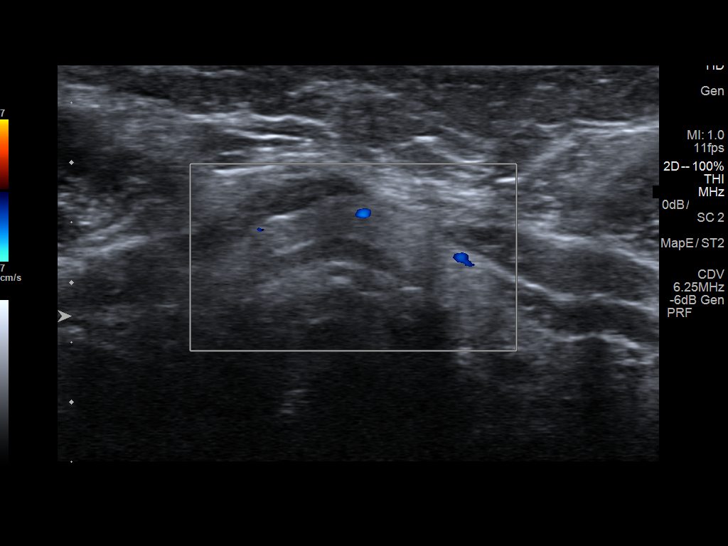
[im 8/57]
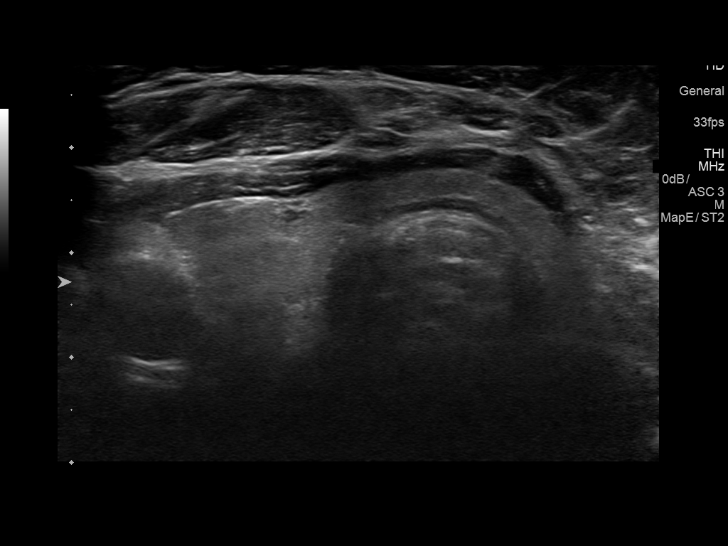
[im 12/57]
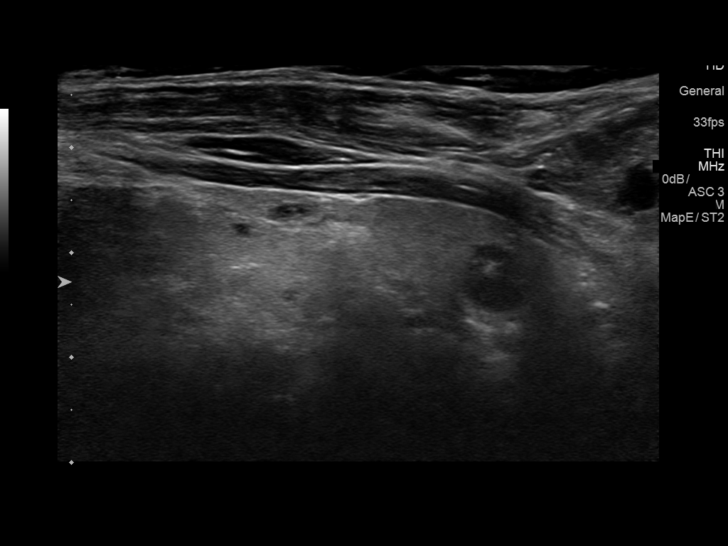
[im 17/57]
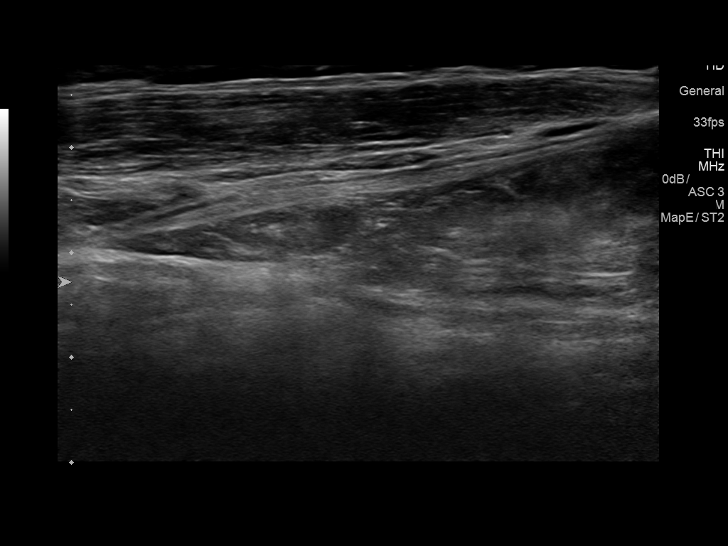
[im 22/57]
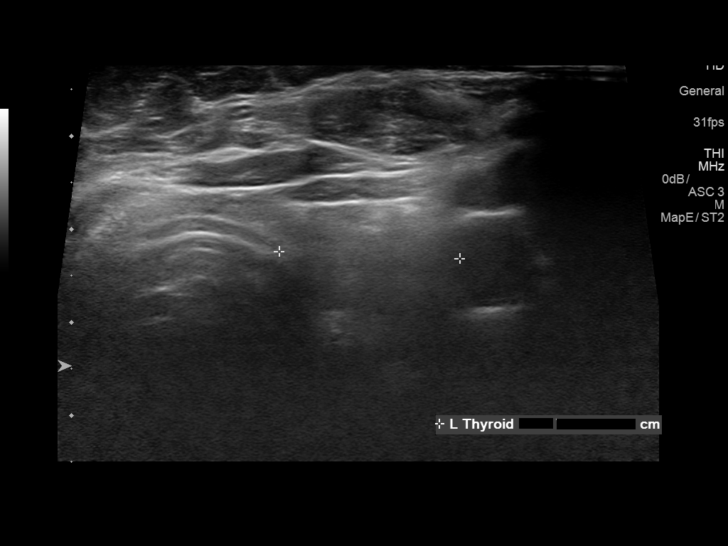
[im 26/57]
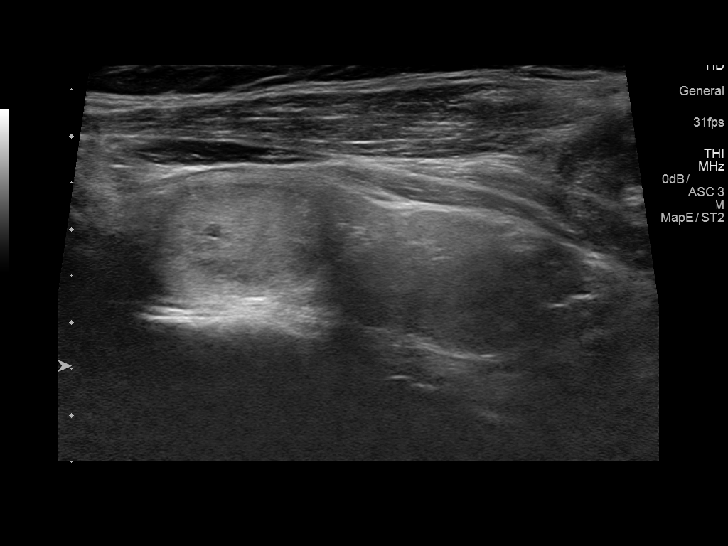
[im 31/57]
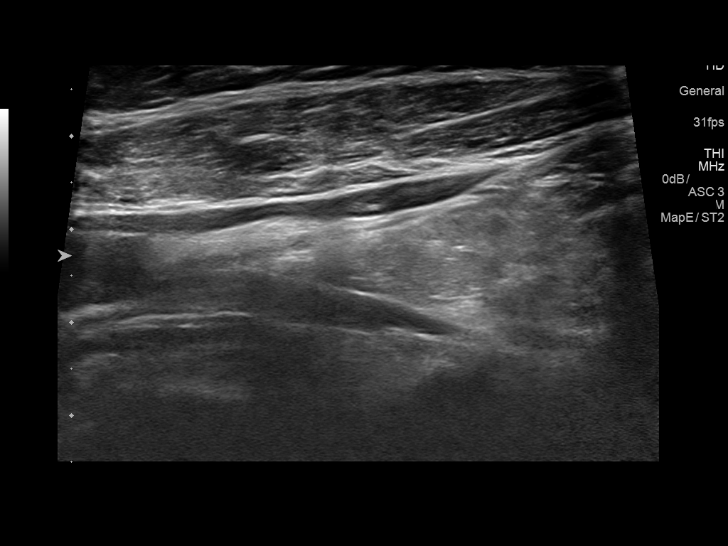
[im 36/57]
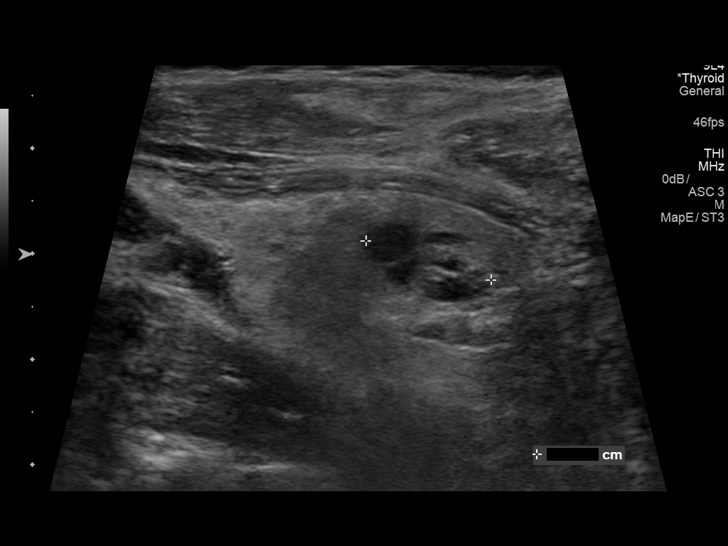
[im 40/57]
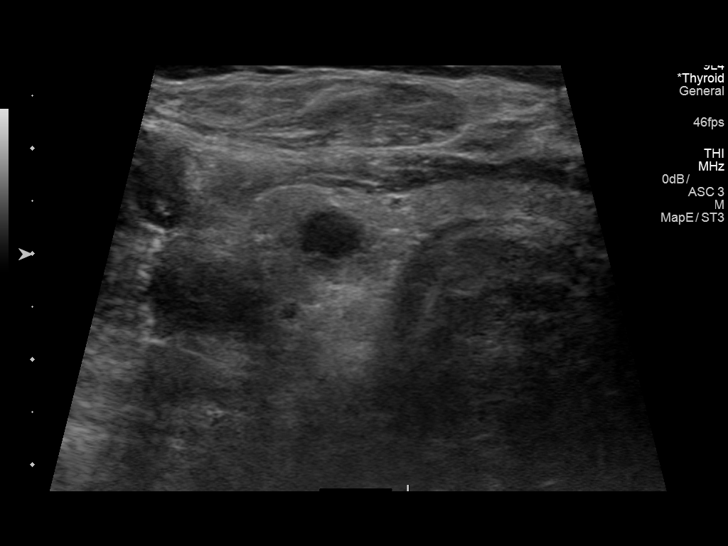
[im 45/57]
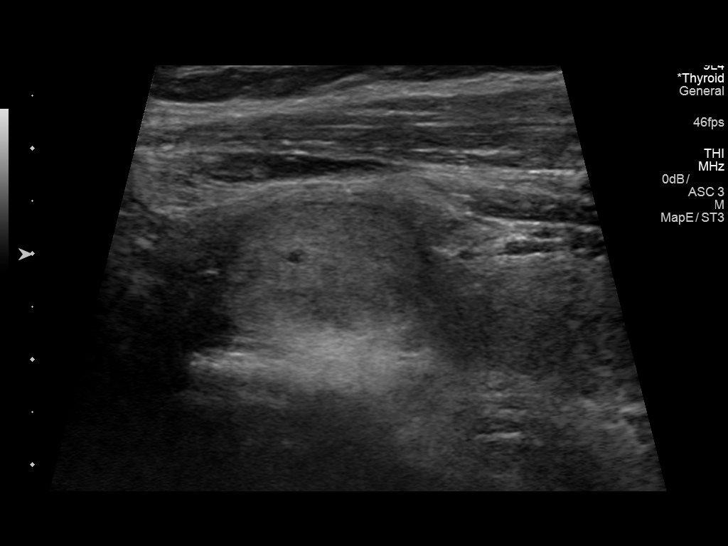
[im 50/57]
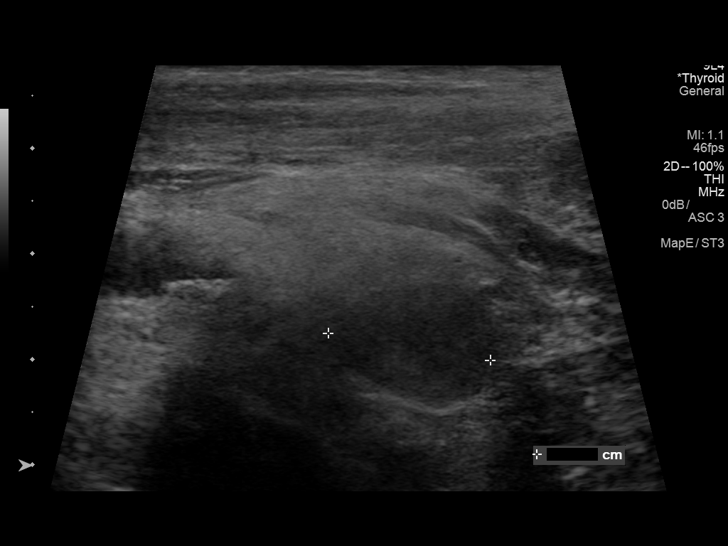
[im 54/57]
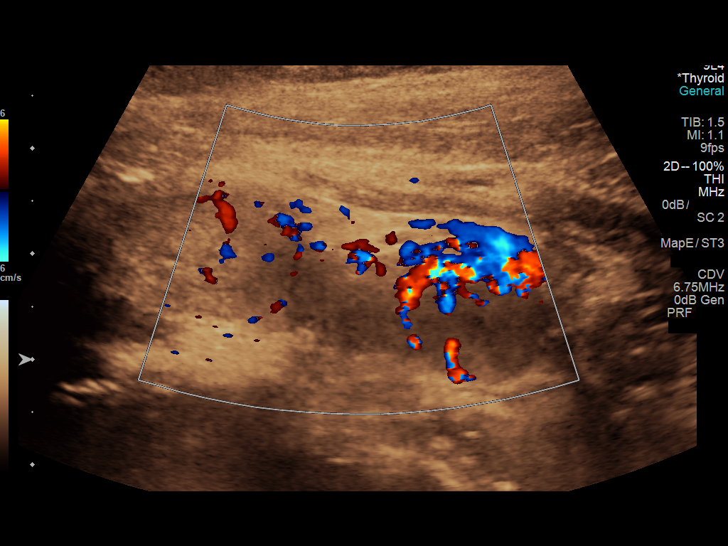

[12 of 25 positions shown; findings below may reference images not displayed]

FINDINGS: Parenchymal Echotexture: Moderately heterogenous

Isthmus: 0.4 cm, previously 0.2 cm

Right lobe: 4.5 x 1.6 x 1.7 cm, previously 5.2 x 1.6 x 1.7 cm

Left lobe: 4.7 x 1.4 x 1.9 cm, previously 4.3 x 1.1 x 2.0 cm

_________________________________________________________

Estimated total number of nodules >/= 1 cm: 3

Number of spongiform nodules >/=  2 cm not described below (TR1): 0

Number of mixed cystic and solid nodules >/= 1.5 cm not described
below (TR2): 0

_________________________________________________________

Nodule # 1:

Prior biopsy: No

Location: Right; Inferior

Maximum size: 1.2 cm; Other 2 dimensions: 1.0 x 0.6 cm, previously,
1.1 x 1.0 x 0.8 cm

Composition: mixed cystic and solid (1)

Echogenicity: isoechoic (1)

Shape: not taller-than-wide (0)

Margins: smooth (0)

Echogenic foci: none (0)

ACR TI-RADS total points: 2.

ACR TI-RADS risk category:  TR2 (2 points).

Significant change in size (>/= 20% in two dimensions and minimal
increase of 2 mm): No

Change in features: No

Change in ACR TI-RADS risk category: No

ACR TI-RADS recommendations:

This nodule does NOT meet TI-RADS criteria for biopsy or dedicated
follow-up.

_________________________________________________________

Nodule # 2:

Prior biopsy: No

Location: Left; Superior

Maximum size: 1.7 cm; Other 2 dimensions: 1.3 x 1.1 cm, previously,
1.8 x 1.4 x 1.1 cm

Composition: solid/almost completely solid (2)

Echogenicity: hyperechoic (1)

Shape: not taller-than-wide (0)

Margins: smooth (0)

Echogenic foci: none (0)

ACR TI-RADS total points: 3.

ACR TI-RADS risk category:  TR3 (3 points).

Significant change in size (>/= 20% in two dimensions and minimal
increase of 2 mm): No

Change in features: No

Change in ACR TI-RADS risk category: No

ACR TI-RADS recommendations:

*Given size (>/= 1.5 - 2.4 cm) and appearance, a follow-up
ultrasound in 1 year should be considered based on TI-RADS criteria.

_________________________________________________________

Nodule # 3:

Prior biopsy: No

Location: Left; Inferior

Maximum size: 1.6 cm; Other 2 dimensions: 1.0 x 1.0 cm, previously,
1.1 x 1.1 x 0.9 cm

Composition: solid/almost completely solid (2)

Echogenicity: hypoechoic (2)

Shape: not taller-than-wide (0)

Margins: smooth (0)

Echogenic foci: none (0)

ACR TI-RADS total points: 4.

ACR TI-RADS risk category:  TR4 (4-6 points).

Significant change in size (>/= 20% in two dimensions and minimal
increase of 2 mm): Yes

Change in features: No

Change in ACR TI-RADS risk category: No

ACR TI-RADS recommendations:

**Given size (>/= 1.5 cm) and appearance, fine needle aspiration of
this moderately suspicious nodule should be considered based on
TI-RADS criteria.

_________________________________________________________
IMPRESSION: Nodule 1 does not meet criteria for biopsy nor follow-up. It is
stable.

Nodule 2 meets criteria for annual follow-up and is stable.

Nodule 3 has enlarged and meets criteria for fine needle aspiration
biopsy.

The above is in keeping with the ACR TI-RADS recommendations - [HOSPITAL] 8305;[DATE].

## 2019-01-19 IMAGING — US US FNA BIOPSY THYROID 1ST LESION
1 series · 13 of 15 positions shown · non-contrast
Comparison: 11/25/2017

MEDICATIONS:
None

COMPLICATIONS:
None immediate.

INDICATION: Indeterminate thyroid nodule

EXAM:
ULTRASOUND GUIDED FINE NEEDLE ASPIRATION OF INDETERMINATE THYROID
NODULE
TECHNIQUE: Informed written consent was obtained from the patient after a
discussion of the risks, benefits and alternatives to treatment.
Questions regarding the procedure were encouraged and answered. A
timeout was performed prior to the initiation of the procedure.

[Series 1: us fna biopsy thyroid 1st lesion · 0.07mm/px · 15 acquisitions, 13 frames shown]
[im 1/15]
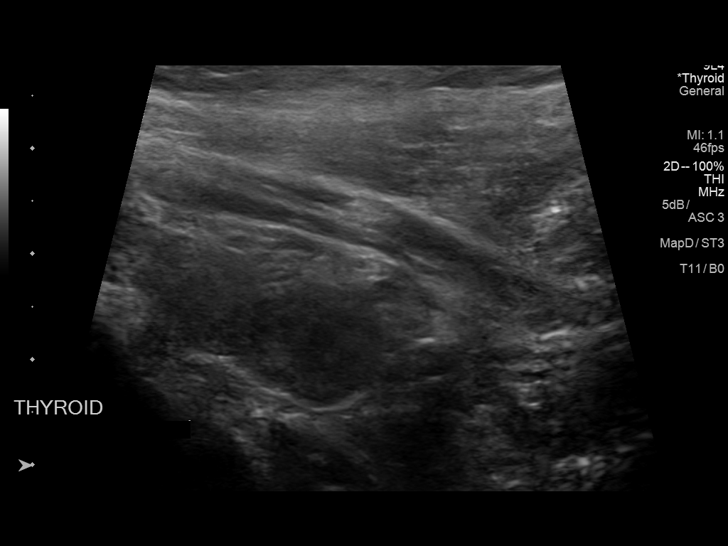
[im 2/15]
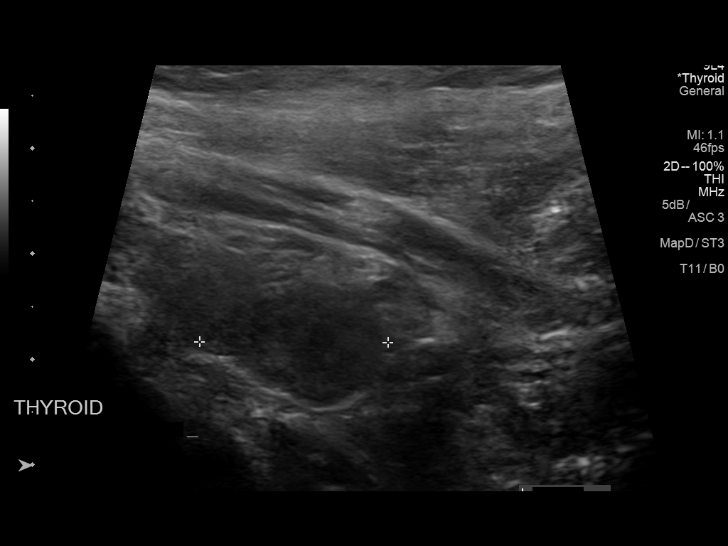
[im 3/15]
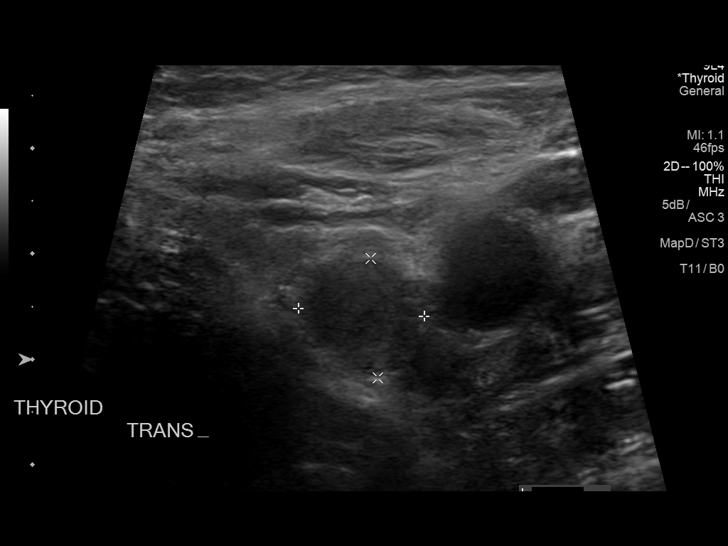
[im 5/15]
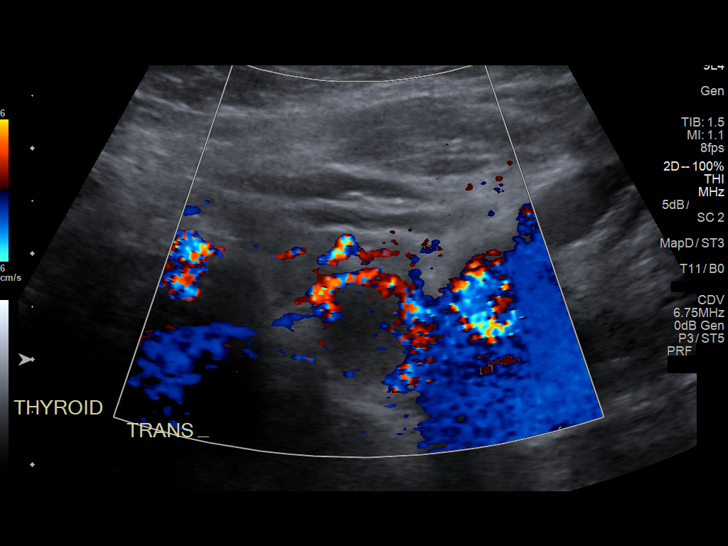
[im 6/15]
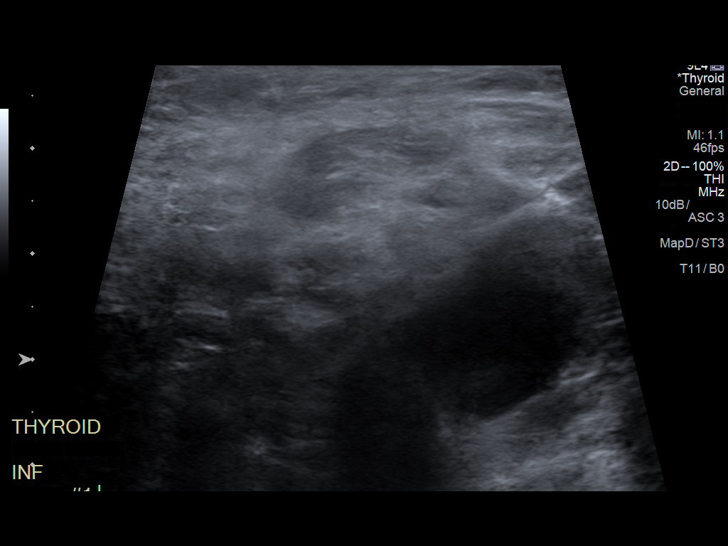
[im 7/15]
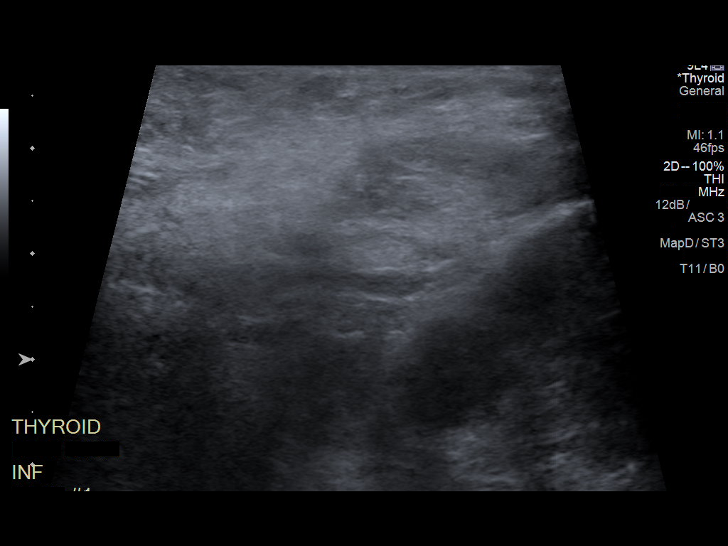
[im 8/15]
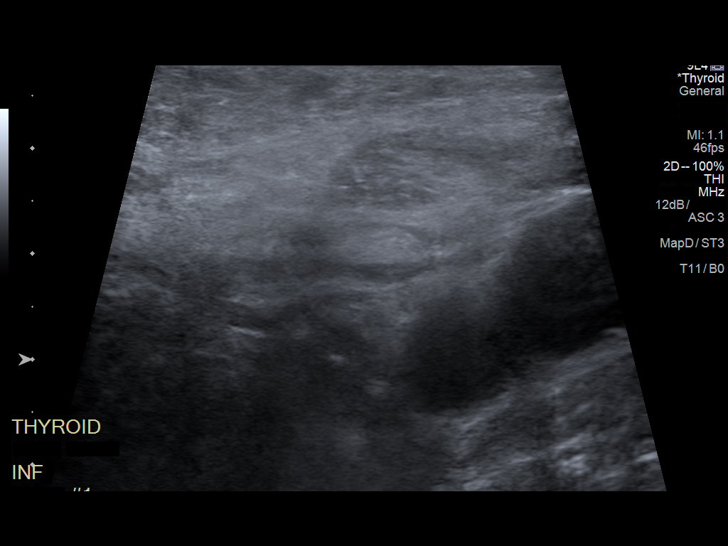
[im 9/15]
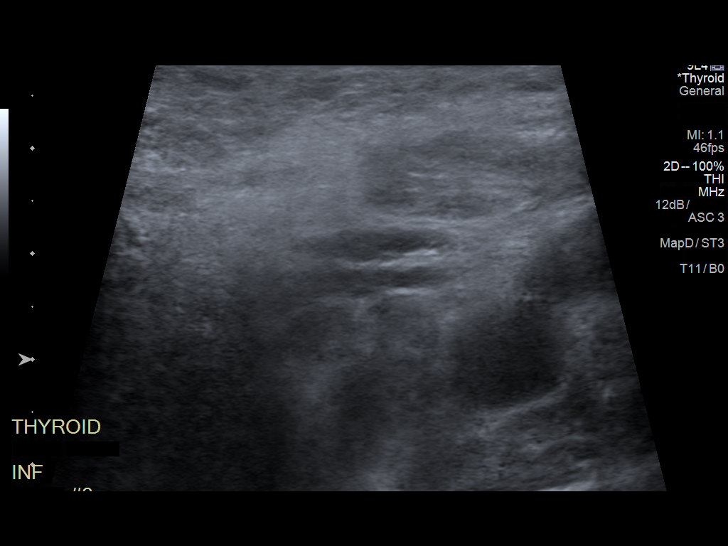
[im 10/15]
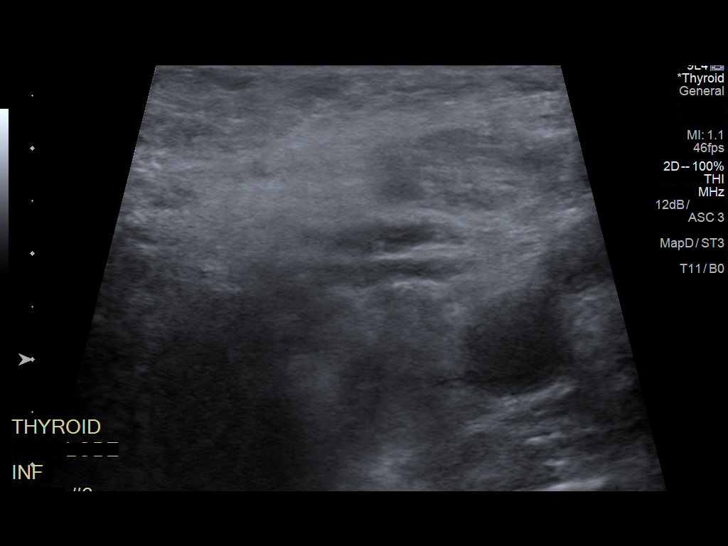
[im 11/15]
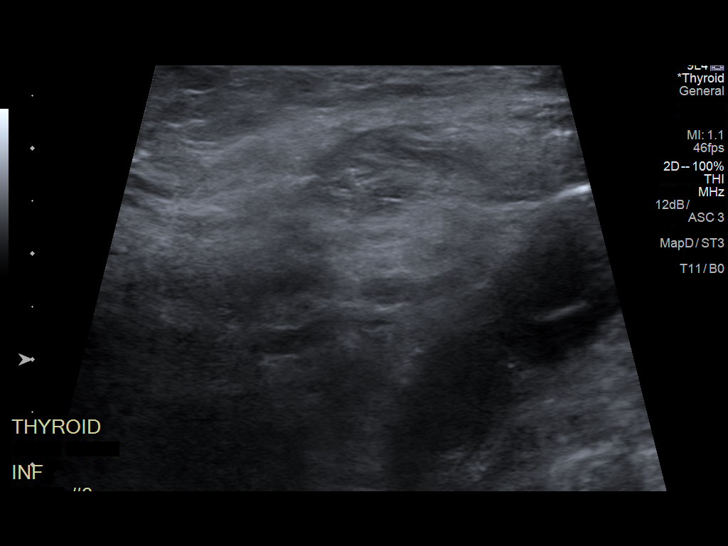
[im 13/15]
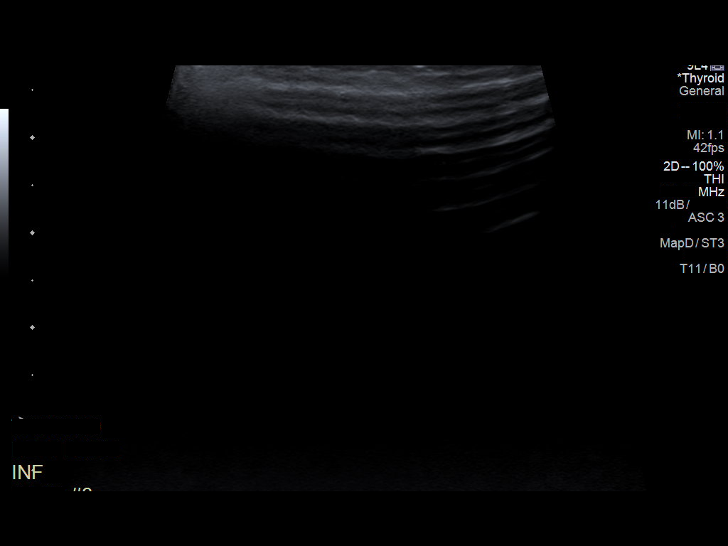
[im 14/15]
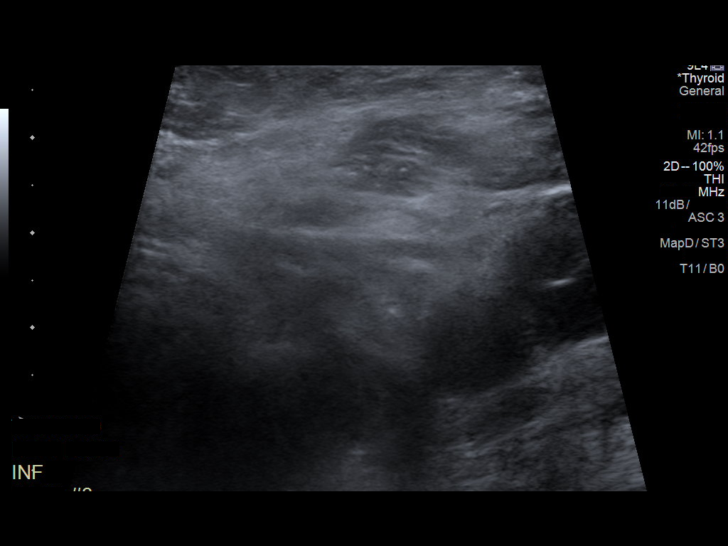
[im 15/15]
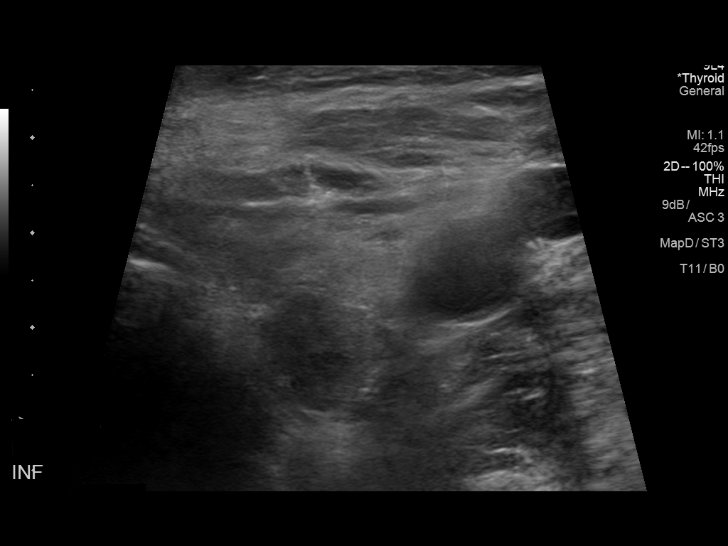

[13 of 15 positions shown; findings below may reference images not displayed]

Pre-procedural ultrasound scanning demonstrated unchanged size and
appearance of the indeterminate nodule within the inferior left
thyroid lobe.

The procedure was planned. The neck was prepped in the usual sterile
fashion, and a sterile drape was applied covering the operative
field. A timeout was performed prior to the initiation of the
procedure. Local anesthesia was provided with 1% lidocaine.

Under direct ultrasound guidance, 5 FNA biopsies were performed
using a combination of 25 gauge needles and a 22 gauge Inrad needle.
Multiple ultrasound images were saved for procedural documentation
purposes. The samples were prepared and submitted to pathology.

Limited post procedural scanning was negative for hematoma or
additional complication. Dressings were placed. The patient
tolerated the above procedures procedure well without immediate
postprocedural complication.
FINDINGS: Nodule reference number based on prior diagnostic ultrasound: 3

Maximum size: 1.8 cm, based on today's examination

Location: Left; Inferior

ACR TI-RADS risk category: TR4 (4-6 points)

Reason for biopsy: meets ACR TI-RADS criteria

Ultrasound imaging confirms appropriate placement of the needles
within the thyroid nodule.
IMPRESSION: Technically successful ultrasound guided fine needle aspiration of
left inferior thyroid nodule.

## 2019-02-01 ENCOUNTER — Ambulatory Visit: Payer: Medicare Other | Admitting: Interventional Cardiology

## 2019-04-01 ENCOUNTER — Other Ambulatory Visit (HOSPITAL_COMMUNITY): Payer: Medicare Other

## 2019-04-01 ENCOUNTER — Ambulatory Visit: Payer: Medicare Other | Admitting: Vascular Surgery

## 2019-04-01 ENCOUNTER — Encounter (HOSPITAL_COMMUNITY): Payer: Medicare Other

## 2019-04-29 ENCOUNTER — Ambulatory Visit: Payer: Medicare Other | Admitting: Vascular Surgery

## 2019-04-29 ENCOUNTER — Other Ambulatory Visit (HOSPITAL_COMMUNITY): Payer: Medicare Other

## 2019-04-29 ENCOUNTER — Encounter (HOSPITAL_COMMUNITY): Payer: Medicare Other

## 2019-05-11 NOTE — Progress Notes (Signed)
Cardiology Office Note:    Date:  05/12/2019   ID:  [name], DOB 09/12/1945, MRN [id number]  PCP:  Adam Low, MD  Cardiologist:  No primary care provider on file.   Referring MD: Adam Low, MD   Chief Complaint  Patient presents with  . Coronary Artery Disease    History of Present Illness:    [name] is a 73 y.o. 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  Is doing relatively well.  Gives out quicker related both to fatigue and shortness of breath.  He has not had syncope.  Denies palpitations.  Rare episodes of chest pain that are relieved by sublingual nitroglycerin although frequency of use is quite rare.  No medication side effects.  No bleeding on Coumadin and Plavix.  Past Medical History:  Diagnosis Date  . Abdominal pain   . Afib ([place])   . Arthritis    in back  . Bruises easily   . Change in voice   . Chest pain   . CHF (congestive heart failure) ([place])   . Coronary artery disease   . Cough   . Diabetes mellitus Age 76  . Diarrhea   . Fungal infection of toenail 10-2014   left great toe  . Generalized headaches   . Heart attack ([place])   . Hx of CABG 1993  . Hyperlipidemia   . Hypertension   . Hyperthyroidism   . Ischemic cardiomyopathy   . Leg pain 03/17/2009   With walking  . Leg swelling   . Myocardial infarction ([place])   . PAD (peripheral artery disease) (HCC)    Right SFA stent  . 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 PVD (peripheral vascular disease) ([place])   . Rash   . Reflux   . Retinopathy   . Trouble swallowing   . Tubular adenoma   . Weakness   . Weight loss, unintentional   . Wheezing     Past Surgical History:  Procedure Laterality Date  . ABDOMINAL AORTAGRAM N/A 11/11/2014   Procedure: ABDOMINAL Maxcine Ham;  Surgeon: Elam Dutch, MD;   Location: [facility] CATH LAB;  Service: Cardiovascular;  Laterality: N/A;  . BACK SURGERY  1994   L5 discectomy  . CATARACT EXTRACTION, BILATERAL    . CORONARY ARTERY BYPASS GRAFT  1993-2001   X2  . PR VEIN BYPASS GRAFT,AORTO-FEM-POP  2008    Current Medications: Current Meds  Medication Sig  . acetaminophen (TYLENOL) 500 MG tablet Take 1,000 mg by mouth 2 (two) times daily as needed for pain (pain/headache).  Marland Kitchen atorvastatin (LIPITOR) 40 MG tablet Take 40 mg by mouth daily.    . Blood Glucose Monitoring Suppl (ACCU-CHEK AVIVA PLUS) W/DEVICE KIT   . carvedilol (COREG) 25 MG tablet Take 25 mg by mouth 2 (two) times daily with a meal.    . clopidogrel (PLAVIX) 75 MG tablet Take 75 mg by mouth daily.    . furosemide (LASIX) 80 MG tablet Take 120 mg by mouth daily.   . metFORMIN (GLUCOPHAGE) 500 MG tablet Take 500 mg by mouth 2 (two) times daily with a meal.  . metroNIDAZOLE (METROGEL) 1 % gel Apply 1 application topically daily as needed (rosacia).   Marland Kitchen NIFEdipine (PROCARDIA XL/ADALAT-CC) 90 MG 24 hr tablet Take 1 tablet by mouth once a day  .  nitroGLYCERIN (NITROSTAT) 0.4 MG SL tablet Place 0.4 mg under the tongue every 5 (five) minutes as needed for chest pain.   . potassium chloride SA (K-DUR,KLOR-CON) 20 MEQ tablet Take 20 mEq by mouth daily.  Marland Kitchen telmisartan (MICARDIS) 80 MG tablet Take 80 mg by mouth daily.    Marland Kitchen warfarin (COUMADIN) 5 MG tablet Take 5-7.5 mg by mouth daily. 7.5 mg daily Monday through Friday and '5mg'$  on Saturday and Sunday     Allergies:   Amiodarone, Erythromycin, and Penicillins   Social History   Socioeconomic History  . Marital status: Married    Spouse name: Not on file  . Number of children: Not on file  . Years of education: Not on file  . Highest education level: Not on file  Occupational History  . Not on file  Social Needs  . Financial resource strain: Not on file  . Food insecurity    Worry: Not on file    Inability: Not on file  . Transportation needs     Medical: Not on file    Non-medical: Not on file  Tobacco Use  . Smoking status: Former Smoker    Types: Cigarettes    Quit date: 09/03/1983    Years since quitting: 35.7  . Smokeless tobacco: Never Used  Substance and Sexual Activity  . Alcohol use: No  . Drug use: No  . Sexual activity: Not on file  Lifestyle  . Physical activity    Days per week: Not on file    Minutes per session: Not on file  . Stress: Not on file  Relationships  . Social Herbalist on phone: Not on file    Gets together: Not on file    Attends religious service: Not on file    Active member of club or organization: Not on file    Attends meetings of clubs or organizations: Not on file    Relationship status: Not on file  Other Topics Concern  . Not on file  Social History Narrative  . Not on file     Family History: The patient's family history includes Cancer in his brother, father, maternal aunt, and mother; Deep vein thrombosis in his brother; Diabetes in his daughter, father, and mother; Heart disease in his daughter, mother, and another family member; Hyperlipidemia in his brother, daughter, and mother; Hypertension in his brother, daughter, father, and mother; Peripheral vascular disease in his daughter.  ROS:   Please see the history of present illness.    Feels he is losing energy.  Bilateral calf claudication, stable.  All other systems reviewed and are negative.  EKGs/Labs/Other Studies Reviewed:    The following studies were reviewed today: Most recent 2D Doppler echocardiogram September 2014: Study Conclusions   - Left ventricle: Systolic function was mildly to moderately  reduced. The estimated ejection fraction was in the range  of 40% to 45%. There is severe hypokinesis of the  basal-midinferior myocardium. Doppler parameters are  consistent with a reversible restrictive pattern,  indicative of decreased left ventricular diastolic  compliance and/or  increased left atrial pressure (grade 3  diastolic dysfunction). Doppler parameters are consistent  with high ventricular filling pressure.  - Aortic valve: Mild regurgitation.  - Mitral valve: Mild regurgitation.  - Left atrium: The atrium was moderately to severely  dilated.  - Tricuspid valve: Moderate regurgitation.  - Pulmonary arteries: Systolic pressure was mildly  increased. PA peak pressure: 91m Hg (S).   EKG:  EKG  sinus bradycardia, interventricular conduction delay, atrial fibrillation with slow response, poor R wave progression, inferior T wave inversion.  Since last tracing in November 2018, rate is faster.  Recent Labs: No results found for requested labs within last 8760 hours.  Recent Lipid Panel No results found for: CHOL, TRIG, HDL, CHOLHDL, VLDL, LDLCALC, LDLDIRECT  Physical Exam:    VS:  BP (!) 108/58   Pulse (!) 57   Ht _0  (1.778 m)   Wt 231 lb 8 oz (105 kg)   SpO2 98%   BMI 33.22 kg/m     Wt Readings from Last 3 Encounters:  05/12/19 231 lb 8 oz (105 kg)  09/11/17 262 lb (118.8 kg)  07/29/17 257 lb 12.8 oz (116.9 kg)     GEN: Moderate obesity. No acute distress HEENT: Normal NECK: No JVD. LYMPHATICS: No lymphadenopathy CARDIAC:  RRR without murmur, gallop.  There is trace bilateral ankle edema. VASCULAR: Decreased pulses in right popliteal and right posterior tibial.  Absent left radial pulse. RESPIRATORY:  Clear to auscultation without rales, wheezing or rhonchi  ABDOMEN: Soft, non-tender, non-distended, No pulsatile mass, MUSCULOSKELETAL: No deformity  SKIN: Warm and dry NEUROLOGIC:  Alert and oriented x 3 PSYCHIATRIC:  Normal affect   ASSESSMENT:    1. Coronary artery disease involving coronary bypass graft of native heart without angina pectoris   2. Chronic systolic HF (heart failure) (HCC)   3. Atherosclerotic peripheral vascular disease with intermittent claudication (HCC)   4. Paroxysmal atrial fibrillation ([place])   5.  Essential hypertension   6. Chronic anticoagulation   7. Educated About Covid-19 Virus Infection    PLAN:    In order of problems listed above:  1. Angina, is stable.  Secondary prevention is discussed. 2. We will update his echocardiogram.  He is currently on ARB and beta-blocker therapy for guideline mandated therapy. 3. Claudication, stable over the past year. 4. Atrial fibrillation is now chronic.  Present on today's EKG. 5. Blood pressure is excellent. 6. No bleeding on current medical regimen. 7. Social distancing, handwashing, and face mask wearing to prevent COVID.  Overall education and awareness concerning primary/secondary risk prevention was discussed in detail: LDL less than 70, hemoglobin A1c less than 7, blood pressure target less than 130/80 mmHg, >150 minutes of moderate aerobic activity per week, avoidance of smoking, weight control (via diet and exercise), and continued surveillance/management of/for obstructive sleep apnea.    Medication Adjustments/Labs and Tests Ordered: Current medicines are reviewed at length with the patient today.  Concerns regarding medicines are outlined above.  Orders Placed This Encounter  Procedures  . EKG 12-Lead   No orders of the defined types were placed in this encounter.   There are no Patient Instructions on file for this visit.   Signed, Sinclair Grooms, MD  05/12/2019 10:19 AM    [place]

## 2019-05-12 ENCOUNTER — Ambulatory Visit (INDEPENDENT_AMBULATORY_CARE_PROVIDER_SITE_OTHER): Payer: Medicare Other | Admitting: Interventional Cardiology

## 2019-05-12 ENCOUNTER — Encounter: Payer: Self-pay | Admitting: Interventional Cardiology

## 2019-05-12 ENCOUNTER — Other Ambulatory Visit: Payer: Self-pay

## 2019-05-12 VITALS — BP 108/58 | HR 57 | Ht 70.0 in | Wt 231.5 lb

## 2019-05-12 DIAGNOSIS — I480 Paroxysmal atrial fibrillation: Secondary | ICD-10-CM | POA: Diagnosis not present

## 2019-05-12 DIAGNOSIS — I25810 Atherosclerosis of coronary artery bypass graft(s) without angina pectoris: Secondary | ICD-10-CM | POA: Diagnosis not present

## 2019-05-12 DIAGNOSIS — I5022 Chronic systolic (congestive) heart failure: Secondary | ICD-10-CM | POA: Diagnosis not present

## 2019-05-12 DIAGNOSIS — I70219 Atherosclerosis of native arteries of extremities with intermittent claudication, unspecified extremity: Secondary | ICD-10-CM | POA: Diagnosis not present

## 2019-05-12 DIAGNOSIS — Z7901 Long term (current) use of anticoagulants: Secondary | ICD-10-CM

## 2019-05-12 DIAGNOSIS — I10 Essential (primary) hypertension: Secondary | ICD-10-CM

## 2019-05-12 DIAGNOSIS — Z7189 Other specified counseling: Secondary | ICD-10-CM

## 2019-05-12 NOTE — Patient Instructions (Signed)
Medication Instructions:  Your physician recommends that you continue on your current medications as directed. Please refer to the Current Medication list given to you today.  If you need a refill on your cardiac medications before your next appointment, please call your pharmacy.   Lab work: None If you have labs (blood work) drawn today and your tests are completely normal, you will receive your results only by: Marland Kitchen MyChart Message (if you have MyChart) OR . A paper copy in the mail If you have any lab test that is abnormal or we need to change your treatment, we will call you to review the results.  Testing/Procedures: Your physician has requested that you have an echocardiogram. Echocardiography is a painless test that uses sound waves to create images of your heart. It provides your doctor with information about the size and shape of your heart and how well your heart's chambers and valves are working. This procedure takes approximately one hour. There are no restrictions for this procedure.   Follow-Up: At [facility], you and your health needs are our priority.  As part of our continuing mission to provide you with exceptional heart care, we have created designated Provider Care Teams.  These Care Teams include your primary Cardiologist (physician) and Advanced Practice Providers (APPs -  Physician Assistants and Nurse Practitioners) who all work together to provide you with the care you need, when you need it. You will need a follow up appointment in 12 months.  Please call our office 2 months in advance to schedule this appointment.  You may see Dr. Tamala Julian or one of the following Advanced Practice Providers on your designated Care Team:   Truitt Merle, NP Cecilie Kicks, NP . Kathyrn Drown, NP  Any Other Special Instructions Will Be Listed Below (If Applicable).

## 2019-05-18 ENCOUNTER — Ambulatory Visit (HOSPITAL_COMMUNITY): Payer: Medicare Other | Attending: Interventional Cardiology

## 2019-05-18 ENCOUNTER — Other Ambulatory Visit: Payer: Self-pay

## 2019-05-18 DIAGNOSIS — I5022 Chronic systolic (congestive) heart failure: Secondary | ICD-10-CM | POA: Diagnosis not present

## 2019-05-27 ENCOUNTER — Ambulatory Visit: Payer: Medicare Other | Admitting: Vascular Surgery

## 2019-05-27 ENCOUNTER — Encounter (HOSPITAL_COMMUNITY): Payer: Medicare Other

## 2019-05-27 ENCOUNTER — Other Ambulatory Visit (HOSPITAL_COMMUNITY): Payer: Medicare Other

## 2019-06-23 ENCOUNTER — Other Ambulatory Visit: Payer: Self-pay

## 2019-06-23 DIAGNOSIS — I739 Peripheral vascular disease, unspecified: Secondary | ICD-10-CM

## 2019-06-24 ENCOUNTER — Encounter: Payer: Self-pay | Admitting: *Deleted

## 2019-06-24 ENCOUNTER — Ambulatory Visit: Payer: Medicare Other | Admitting: Vascular Surgery

## 2019-06-24 ENCOUNTER — Ambulatory Visit (HOSPITAL_COMMUNITY)
Admission: RE | Admit: 2019-06-24 | Discharge: 2019-06-24 | Disposition: A | Discharge status: Home / Self Care | Payer: Medicare Other | Source: Ambulatory Visit | Attending: Vascular Surgery | Admitting: Vascular Surgery

## 2019-06-24 ENCOUNTER — Other Ambulatory Visit: Payer: Self-pay

## 2019-06-24 ENCOUNTER — Ambulatory Visit (INDEPENDENT_AMBULATORY_CARE_PROVIDER_SITE_OTHER)
Admission: RE | Admit: 2019-06-24 | Discharge: 2019-06-24 | Disposition: A | Discharge status: Home / Self Care | Payer: Medicare Other | Source: Ambulatory Visit | Attending: Vascular Surgery | Admitting: Vascular Surgery

## 2019-06-24 ENCOUNTER — Encounter: Payer: Self-pay | Admitting: Vascular Surgery

## 2019-06-24 VITALS — BP 142/74 | HR 63 | Temp 97.4°F | Resp 20 | Ht 70.0 in | Wt 232.7 lb

## 2019-06-24 DIAGNOSIS — I739 Peripheral vascular disease, unspecified: Secondary | ICD-10-CM

## 2019-06-25 NOTE — Progress Notes (Signed)
Patient is a 73 year old male who returns for follow-up today regarding peripheral arterial disease.  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 Drucie Opitz.  He also had a right superficial femoral artery stent placed by Dr. Illene Bolus in 2008.  He currently has no symptoms of claudication or rest pain.  He has no open wounds on his feet.  Chronic medical problems include diabetes hypertension hyperlipidemia coronary artery disease and atrial fibrillation.  These are all currently stable.  He is on warfarin.  Review of systems: He has no shortness of breath.  He has no chest pain.  Current Outpatient Medications on File Prior to Visit  Medication Sig Dispense Refill  . acetaminophen (TYLENOL) 500 MG tablet Take 1,000 mg by mouth 2 (two) times daily as needed for pain (pain/headache).    Marland Kitchen atorvastatin (LIPITOR) 40 MG tablet Take 40 mg by mouth daily.      . Blood Glucose Monitoring Suppl (ACCU-CHEK AVIVA PLUS) W/DEVICE KIT     . carvedilol (COREG) 25 MG tablet Take 25 mg by mouth 2 (two) times daily with a meal.      . clopidogrel (PLAVIX) 75 MG tablet Take 75 mg by mouth daily.      . furosemide (LASIX) 80 MG tablet Take 120 mg by mouth daily.     . metFORMIN (GLUCOPHAGE) 500 MG tablet Take 500 mg by mouth 2 (two) times daily with a meal.    . metroNIDAZOLE (METROGEL) 1 % gel Apply 1 application topically daily as needed (rosacia).     Marland Kitchen NIFEdipine (PROCARDIA XL/ADALAT-CC) 90 MG 24 hr tablet Take 1 tablet by mouth once a day 90 tablet 2  . nitroGLYCERIN (NITROSTAT) 0.4 MG SL tablet Place 0.4 mg under the tongue every 5 (five) minutes as needed for chest pain.     . potassium chloride SA (K-DUR,KLOR-CON) 20 MEQ tablet Take 20 mEq by mouth daily.    Marland Kitchen telmisartan (MICARDIS) 80 MG tablet Take 80 mg by mouth daily.      Marland Kitchen warfarin (COUMADIN) 5 MG tablet Take 5-7.5 mg by mouth daily. 7.5 mg daily Monday through Friday and 38m on  Saturday and Sunday     No current facility-administered medications on file prior to visit.     Past Surgical History:  Procedure Laterality Date  . ABDOMINAL AORTAGRAM N/A 11/11/2014   Procedure: ABDOMINAL AMaxcine Ham  Surgeon: CElam Dutch MD;  [facility];  Service: Cardiovascular;  Laterality: N/A;  . BACK SURGERY  1994   L5 discectomy  . CATARACT EXTRACTION, BILATERAL    . CORONARY ARTERY BYPASS GRAFT  1993-2001   X2  . PR VEIN BYPASS GRAFT,AORTO-FEM-POP  2008    Past Medical History:  Diagnosis Date  . Abdominal pain   . [place]ib ([place]   . Arthritis    in back  . Bruises easily   . Change in voice   . Chest pain   . CHF ([place]estive heart failure) ([place]   . Coronary artery disease   . Cough   . Diabetes mellitus Age 89 . Diarrhea   . Fungal infection of toenail 10-2014   left great toe  . Generalized headaches   . Heart attack ([place]   . Hx of CABG 1993  . Hyperlipidemia   . Hypertension   . Hyperthyroidism   . Ischemic cardiomyopathy   . Leg pain 03/17/2009   With walking  . Leg  swelling   . Myocardial infarction ([place])   . PAD (peripheral artery disease) (HCC)    Right SFA stent  . 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 PVD (peripheral vascular disease) ([place])   . Rash   . Reflux   . Retinopathy   . Trouble swallowing   . Tubular adenoma   . Weakness   . Weight loss, unintentional   . Wheezing     Social History   Socioeconomic History  . Marital status: Married    Spouse name: Not on file  . Number of children: Not on file  . Years of education: Not on file  . Highest education level: Not on file  Occupational History  . Not on file  Social Needs  . Financial resource strain: Not on file  . Food insecurity    Worry: Not on file    Inability: Not on file  . Transportation needs    Medical: Not on file    Non-medical: Not on file  Tobacco Use  . Smoking status: Former Smoker    Types: Cigarettes     Quit date: 09/03/1983    Years since quitting: 35.8  . Smokeless tobacco: Never Used  Substance and Sexual Activity  . Alcohol use: No  . Drug use: No  . Sexual activity: Not on file  Lifestyle  . Physical activity    Days per week: Not on file    Minutes per session: Not on file  . Stress: Not on file  Relationships  . Social Herbalist on phone: Not on file    Gets together: Not on file    Attends religious service: Not on file    Active member of club or organization: Not on file    Attends meetings of clubs or organizations: Not on file    Relationship status: Not on file  . Intimate partner violence    Fear of current or ex partner: Not on file    Emotionally abused: Not on file    Physically abused: Not on file    Forced sexual activity: Not on file  Other Topics Concern  . Not on file  Social History Narrative  . Not on file    Physical exam:  Vitals:   06/24/19 1351  BP: (!) 142/74  Pulse: 63  Resp: 20  Temp: (!) 97.4 F (36.3 C)  SpO2: 99%  Weight: 232 lb 11.2 oz (105.6 kg)  Height: 5' 10" (1.778 m)    Extremities: Left 2+ dorsalis pedis pulse, absent right pedal pulses  Skin: No open ulcer or rash  Data: Patient had a duplex ultrasound today which shows patent left femoral below-knee popliteal bypass.  Right SFA stent had increased velocities of 428 cm/s suggestive of greater than 70% stenosis above and in-stent  ABIs were performed but were unreliable secondary to calcification.  I reviewed and interpreted the studies.  Assessment: Currently asymptomatic stenosis of right superficial femoral artery stent with patent left leg bypass  Plan: Patient will be scheduled for aortogram lower extremity runoff possible intervention on July 23, 2019.  The patient needed to delay this a few weeks as his wife is currently recovering from a recent kidney transplant.  Risk benefits possible complications and procedure details were discussed with patient  today include but not limited to bleeding infection vessel injury.  We will stop his warfarin a few days prior to the procedure.  Ruta Hinds,  MD [facility] Office: [phone]

## 2019-06-25 NOTE — H&P (View-Only) (Signed)
Patient is a 73-year-old male who returns for follow-up today regarding peripheral arterial disease.  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 Greg Hayes.  He also had a right superficial femoral artery stent placed by Dr. Berenice in 2008.  He currently has no symptoms of claudication or rest pain.  He has no open wounds on his feet.  Chronic medical problems include diabetes hypertension hyperlipidemia coronary artery disease and atrial fibrillation.  These are all currently stable.  He is on warfarin.  Review of systems: He has no shortness of breath.  He has no chest pain.  Current Outpatient Medications on File Prior to Visit  Medication Sig Dispense Refill  . acetaminophen (TYLENOL) 500 MG tablet Take 1,000 mg by mouth 2 (two) times daily as needed for pain (pain/headache).    . atorvastatin (LIPITOR) 40 MG tablet Take 40 mg by mouth daily.      . Blood Glucose Monitoring Suppl (ACCU-CHEK AVIVA PLUS) W/DEVICE KIT     . carvedilol (COREG) 25 MG tablet Take 25 mg by mouth 2 (two) times daily with a meal.      . clopidogrel (PLAVIX) 75 MG tablet Take 75 mg by mouth daily.      . furosemide (LASIX) 80 MG tablet Take 120 mg by mouth daily.     . metFORMIN (GLUCOPHAGE) 500 MG tablet Take 500 mg by mouth 2 (two) times daily with a meal.    . metroNIDAZOLE (METROGEL) 1 % gel Apply 1 application topically daily as needed (rosacia).     . NIFEdipine (PROCARDIA XL/ADALAT-CC) 90 MG 24 hr tablet Take 1 tablet by mouth once a day 90 tablet 2  . nitroGLYCERIN (NITROSTAT) 0.4 MG SL tablet Place 0.4 mg under the tongue every 5 (five) minutes as needed for chest pain.     . potassium chloride SA (K-DUR,KLOR-CON) 20 MEQ tablet Take 20 mEq by mouth daily.    . telmisartan (MICARDIS) 80 MG tablet Take 80 mg by mouth daily.      . warfarin (COUMADIN) 5 MG tablet Take 5-7.5 mg by mouth daily. 7.5 mg daily Monday through Friday and 5mg on  Saturday and Sunday     No current facility-administered medications on file prior to visit.     Past Surgical History:  Procedure Laterality Date  . ABDOMINAL AORTAGRAM N/A 11/11/2014   Procedure: ABDOMINAL AORTAGRAM;  Surgeon: Antha Niday E Wildon Cuevas, MD;  Location: [facility] CATH LAB;  Service: Cardiovascular;  Laterality: N/A;  . BACK SURGERY  1994   L5 discectomy  . CATARACT EXTRACTION, BILATERAL    . CORONARY ARTERY BYPASS GRAFT  1993-2001   X2  . PR VEIN BYPASS GRAFT,AORTO-FEM-POP  2008    Past Medical History:  Diagnosis Date  . Abdominal pain   . Afib (HCC)   . Arthritis    in back  . Bruises easily   . Change in voice   . Chest pain   . CHF (congestive heart failure) (HCC)   . Coronary artery disease   . Cough   . Diabetes mellitus Age 45  . Diarrhea   . Fungal infection of toenail 10-2014   left great toe  . Generalized headaches   . Heart attack (HCC)   . Hx of CABG 1993  . Hyperlipidemia   . Hypertension   . Hyperthyroidism   . Ischemic cardiomyopathy   . Leg pain 03/17/2009   With walking  . Leg   swelling   . Myocardial infarction (HCC)   . PAD (peripheral artery disease) (HCC)    Right SFA stent  . 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HCC)    repaired left side Dr Hayes 2008  . PVD (peripheral vascular disease) (HCC)   . Rash   . Reflux   . Retinopathy   . Trouble swallowing   . Tubular adenoma   . Weakness   . Weight loss, unintentional   . Wheezing     Social History   Socioeconomic History  . Marital status: Married    Spouse name: Not on file  . Number of children: Not on file  . Years of education: Not on file  . Highest education level: Not on file  Occupational History  . Not on file  Social Needs  . Financial resource strain: Not on file  . Food insecurity    Worry: Not on file    Inability: Not on file  . Transportation needs    Medical: Not on file    Non-medical: Not on file  Tobacco Use  . Smoking status: Former Smoker    Types: Cigarettes     Quit date: 09/03/1983    Years since quitting: 35.8  . Smokeless tobacco: Never Used  Substance and Sexual Activity  . Alcohol use: No  . Drug use: No  . Sexual activity: Not on file  Lifestyle  . Physical activity    Days per week: Not on file    Minutes per session: Not on file  . Stress: Not on file  Relationships  . Social connections    Talks on phone: Not on file    Gets together: Not on file    Attends religious service: Not on file    Active member of club or organization: Not on file    Attends meetings of clubs or organizations: Not on file    Relationship status: Not on file  . Intimate partner violence    Fear of current or ex partner: Not on file    Emotionally abused: Not on file    Physically abused: Not on file    Forced sexual activity: Not on file  Other Topics Concern  . Not on file  Social History Narrative  . Not on file    Physical exam:  Vitals:   06/24/19 1351  BP: (!) 142/74  Pulse: 63  Resp: 20  Temp: (!) 97.4 F (36.3 C)  SpO2: 99%  Weight: 232 lb 11.2 oz (105.6 kg)  Height: 5' 10" (1.778 m)    Extremities: Left 2+ dorsalis pedis pulse, absent right pedal pulses  Skin: No open ulcer or rash  Data: Patient had a duplex ultrasound today which shows patent left femoral below-knee popliteal bypass.  Right SFA stent had increased velocities of 428 cm/s suggestive of greater than 70% stenosis above and in-stent  ABIs were performed but were unreliable secondary to calcification.  I reviewed and interpreted the studies.  Assessment: Currently asymptomatic stenosis of right superficial femoral artery stent with patent left leg bypass  Plan: Patient will be scheduled for aortogram lower extremity runoff possible intervention on July 23, 2019.  The patient needed to delay this a few weeks as his wife is currently recovering from a recent kidney transplant.  Risk benefits possible complications and procedure details were discussed with patient  today include but not limited to bleeding infection vessel injury.  We will stop his warfarin a few days prior to the procedure.  Beya Tipps,   MD [facility] [place] Office: [phone]

## 2019-07-08 ENCOUNTER — Telehealth: Payer: Self-pay | Admitting: *Deleted

## 2019-07-08 NOTE — Telephone Encounter (Signed)
Returned call to patient instructed to hold Coumadin for 3 days prior to procedure. Take 07/19/2019 dose then HOLD. Refer to instruction letter given at office visit for all other instructions. Verbalized understanding.

## 2019-07-12 ENCOUNTER — Telehealth: Payer: Self-pay | Admitting: Interventional Cardiology

## 2019-07-12 NOTE — Telephone Encounter (Signed)
Patient calling in regards to a procedure that is coming up with Dr. Oneida Alar on 07/23/19. He was wondering if he needs to come off of warfarin (COUMADIN) 5 MG tablet before the procedure. I did state that we will need to speak with someone from Dr. Gentry Fitz office to go over some questions for the surgical clearance but he would like to speak with a nurse.

## 2019-07-12 NOTE — Telephone Encounter (Signed)
I s/w pt in regards to holding warfarin for his upcoming procedure with Dr. Oneida Alar on 07/23/19. I informed the pt that we have not received a clearance form needing cardiac clearance, however since his INR is monitored by his PCP they will give their recommendations as to how to hold his warfarin. I did tell the pt that I will confirm with Dr. Oneida Alar office if needing cardiac clearance.   I s/w Christy at Dr. Oneida Alar office to confirm if needing cardiac clearance. I confirmed no cardiac clearance was needed at this.   I then called the pt back and assured him per Dr. Oneida Alar office no cardiac clearance is needed at this time and that they will obtain recommendations per PCP about warfarin. Pt thanked me for the call.

## 2019-07-19 ENCOUNTER — Other Ambulatory Visit: Payer: Self-pay

## 2019-07-21 ENCOUNTER — Other Ambulatory Visit (HOSPITAL_COMMUNITY)
Admission: RE | Admit: 2019-07-21 | Discharge: 2019-07-21 | Disposition: A | Discharge status: Home / Self Care | Payer: Medicare Other | Source: Ambulatory Visit | Attending: Vascular Surgery | Admitting: Vascular Surgery

## 2019-07-21 DIAGNOSIS — Z20828 Contact with and (suspected) exposure to other viral communicable diseases: Secondary | ICD-10-CM | POA: Diagnosis not present

## 2019-07-21 DIAGNOSIS — Z01812 Encounter for preprocedural laboratory examination: Secondary | ICD-10-CM | POA: Diagnosis present

## 2019-07-21 LAB — SARS CORONAVIRUS 2 (TAT 6-24 HRS): SARS Coronavirus 2: NEGATIVE

## 2019-07-23 ENCOUNTER — Ambulatory Visit (HOSPITAL_COMMUNITY)
Admission: RE | Admit: 2019-07-23 | Discharge: 2019-07-23 | Disposition: A | Discharge status: Home / Self Care | Payer: Medicare Other | Attending: Vascular Surgery | Admitting: Vascular Surgery

## 2019-07-23 ENCOUNTER — Encounter (HOSPITAL_COMMUNITY)
Admission: RE | Disposition: A | Discharge status: Home / Self Care | Payer: Self-pay | Source: Home / Self Care | Attending: Vascular Surgery

## 2019-07-23 DIAGNOSIS — Z87891 Personal history of nicotine dependence: Secondary | ICD-10-CM | POA: Diagnosis not present

## 2019-07-23 DIAGNOSIS — I509 Heart failure, unspecified: Secondary | ICD-10-CM | POA: Diagnosis not present

## 2019-07-23 DIAGNOSIS — I4891 Unspecified atrial fibrillation: Secondary | ICD-10-CM | POA: Insufficient documentation

## 2019-07-23 DIAGNOSIS — Z951 Presence of aortocoronary bypass graft: Secondary | ICD-10-CM | POA: Diagnosis not present

## 2019-07-23 DIAGNOSIS — I252 Old myocardial infarction: Secondary | ICD-10-CM | POA: Insufficient documentation

## 2019-07-23 DIAGNOSIS — I255 Ischemic cardiomyopathy: Secondary | ICD-10-CM | POA: Diagnosis not present

## 2019-07-23 DIAGNOSIS — Z79899 Other long term (current) drug therapy: Secondary | ICD-10-CM | POA: Insufficient documentation

## 2019-07-23 DIAGNOSIS — E1151 Type 2 diabetes mellitus with diabetic peripheral angiopathy without gangrene: Secondary | ICD-10-CM | POA: Diagnosis not present

## 2019-07-23 DIAGNOSIS — Z7901 Long term (current) use of anticoagulants: Secondary | ICD-10-CM | POA: Insufficient documentation

## 2019-07-23 DIAGNOSIS — E785 Hyperlipidemia, unspecified: Secondary | ICD-10-CM | POA: Insufficient documentation

## 2019-07-23 DIAGNOSIS — Z7984 Long term (current) use of oral hypoglycemic drugs: Secondary | ICD-10-CM | POA: Insufficient documentation

## 2019-07-23 DIAGNOSIS — I110 Hypertensive heart disease with heart failure: Secondary | ICD-10-CM | POA: Insufficient documentation

## 2019-07-23 DIAGNOSIS — T82858A Stenosis of vascular prosthetic devices, implants and grafts, initial encounter: Secondary | ICD-10-CM

## 2019-07-23 DIAGNOSIS — I70203 Unspecified atherosclerosis of native arteries of extremities, bilateral legs: Secondary | ICD-10-CM | POA: Insufficient documentation

## 2019-07-23 DIAGNOSIS — I2510 Atherosclerotic heart disease of native coronary artery without angina pectoris: Secondary | ICD-10-CM | POA: Insufficient documentation

## 2019-07-23 HISTORY — PX: ABDOMINAL AORTOGRAM W/LOWER EXTREMITY: CATH118223

## 2019-07-23 LAB — POCT I-STAT, CHEM 8
BUN: 14 mg/dL (ref 8–23)
Calcium, Ion: 1.12 mmol/L — ABNORMAL LOW (ref 1.15–1.40)
Chloride: 102 mmol/L (ref 98–111)
Creatinine, Ser: 1 mg/dL (ref 0.61–1.24)
Glucose, Bld: 119 mg/dL — ABNORMAL HIGH (ref 70–99)
HCT: 39 % (ref 39.0–52.0)
Hemoglobin: 13.3 g/dL (ref 13.0–17.0)
Potassium: 3.4 mmol/L — ABNORMAL LOW (ref 3.5–5.1)
Sodium: 141 mmol/L (ref 135–145)
TCO2: 25 mmol/L (ref 22–32)

## 2019-07-23 LAB — GLUCOSE, CAPILLARY: Glucose-Capillary: 105 mg/dL — ABNORMAL HIGH (ref 70–99)

## 2019-07-23 LAB — PROTIME-INR
INR: 1.3 — ABNORMAL HIGH (ref 0.8–1.2)
Prothrombin Time: 15.9 seconds — ABNORMAL HIGH (ref 11.4–15.2)

## 2019-07-23 SURGERY — ABDOMINAL AORTOGRAM W/LOWER EXTREMITY
Anesthesia: LOCAL | Laterality: Bilateral

## 2019-07-23 MED ORDER — LABETALOL HCL 5 MG/ML IV SOLN
INTRAVENOUS | Status: AC
  Filled 2019-07-23: qty 4

## 2019-07-23 MED ORDER — IODIXANOL 320 MG/ML IV SOLN
INTRAVENOUS | Status: DC | PRN
  Administered 2019-07-23: 127 mL via INTRA_ARTERIAL

## 2019-07-23 MED ORDER — LIDOCAINE HCL (PF) 1 % IJ SOLN
INTRAMUSCULAR | Status: DC | PRN
  Administered 2019-07-23: 20 mL via INTRADERMAL

## 2019-07-23 MED ORDER — SODIUM CHLORIDE 0.9 % IV SOLN
INTRAVENOUS | Status: DC
  Administered 2019-07-23: 06:00:00 via INTRAVENOUS

## 2019-07-23 MED ORDER — SODIUM CHLORIDE 0.9 % IV SOLN: 250.0000 mL | INTRAVENOUS | Status: DC | PRN

## 2019-07-23 MED ORDER — ACETAMINOPHEN 325 MG PO TABS: 650.0000 mg | ORAL_TABLET | ORAL | Status: DC | PRN

## 2019-07-23 MED ORDER — HEPARIN (PORCINE) IN NACL 1000-0.9 UT/500ML-% IV SOLN
INTRAVENOUS | Status: DC | PRN
  Administered 2019-07-23 (×2): 500 mL

## 2019-07-23 MED ORDER — SODIUM CHLORIDE 0.9 % IV SOLN: INTRAVENOUS | Status: DC

## 2019-07-23 MED ORDER — LIDOCAINE HCL (PF) 1 % IJ SOLN
INTRAMUSCULAR | Status: AC
  Filled 2019-07-23: qty 30

## 2019-07-23 MED ORDER — SODIUM CHLORIDE 0.9% FLUSH: 3.0000 mL | INTRAVENOUS | Status: DC | PRN

## 2019-07-23 MED ORDER — HEPARIN (PORCINE) IN NACL 1000-0.9 UT/500ML-% IV SOLN
INTRAVENOUS | Status: AC
  Filled 2019-07-23: qty 1000

## 2019-07-23 MED ORDER — LABETALOL HCL 5 MG/ML IV SOLN
10.0000 mg | INTRAVENOUS | Status: DC | PRN
  Administered 2019-07-23: 09:00:00 10 mg via INTRAVENOUS

## 2019-07-23 MED ORDER — ONDANSETRON HCL 4 MG/2ML IJ SOLN: 4.0000 mg | Freq: Four times a day (QID) | INTRAMUSCULAR | Status: DC | PRN

## 2019-07-23 MED ORDER — SODIUM CHLORIDE 0.9% FLUSH: 3.0000 mL | Freq: Two times a day (BID) | INTRAVENOUS | Status: DC

## 2019-07-23 MED ORDER — HYDRALAZINE HCL 20 MG/ML IJ SOLN: 5.0000 mg | INTRAMUSCULAR | Status: DC | PRN

## 2019-07-23 SURGICAL SUPPLY — 8 items
CATH ANGIO 5F PIGTAIL 65CM (CATHETERS) ×2
KIT PV (KITS) ×2
SHEATH PINNACLE 5F 10CM (SHEATH) ×2
SHEATH PROBE COVER 6X72 (BAG) ×2
SYR MEDRAD MARK V 150ML (SYRINGE) ×2
TRANSDUCER W/STOPCOCK (MISCELLANEOUS) ×2
TRAY PV CATH (CUSTOM PROCEDURE TRAY) ×2
WIRE BENTSON .035X145CM (WIRE) ×2

## 2019-07-23 NOTE — Progress Notes (Signed)
Site area: left groin  Site Prior to Removal:  Level 0  Pressure Applied For 15 MINUTES    Minutes Beginning at [phone]  Manual:   Yes.    Patient Status During Pull:  Stable  Post Pull Groin Site:  Level 0  Post Pull Instructions Given:  Yes.    Post Pull Pulses Present:  Yes.    Dressing Applied:  Yes.    Comments:  Bed rest started at 0910 X 4 hr.

## 2019-07-23 NOTE — Discharge Instructions (Signed)

## 2019-07-23 NOTE — Op Note (Signed)
Procedure: Abdominal aortogram with bilateral lower extremity runoff  Preoperative diagnosis: In-stent restenosis right leg  Postoperative diagnosis: Same  Anesthesia: Local  Operative findings: #1 severe calcific atherosclerosis  2.  70% narrowing right common iliac artery above pre-existing right common iliac stent  3.  Severe calcific atherosclerosis with 70% narrowing of common femoral artery bilaterally  4.  Patent right common iliac artery stent with no significant in-stent restenosis  5.  Patent right superficial femoral artery stent with no significant in-stent restenosis  6.  Patent left femoral to below-knee popliteal bypass with no significant stenosis  7.  Severe calcific diffusely diseased right superficial femoral artery with multiple segments of 70 to 80% stenosis above and below the pre-existing stent  8.  Bilateral three-vessel runoff  Operative details: After pain informed consent, the patient taken the PV lab.  The patient was placed in supine position on the angio table.  Both groins were prepped and draped in usual sterile fashion.  Local anesthesia was infiltrated over the left common femoral artery.  Ultrasound was used to identify the left common femoral artery profunda femoris and a pre-existing femoral-popliteal bypass.  These were all patent.  Image was obtained for the patient's record.  An introducer needle was then used to cannulate the left common femoral artery above the level of the femoropopliteal bypass.  035 versa core wire was then advanced up into the abdominal aorta under fluoroscopic guidance.  5 French sheath placed over the guidewire in the left common femoral artery.  This was thoroughly flushed with heparinized saline.  5 French pigtail catheter was advanced over the guidewire into the abdominal aorta.  Abdominal aortogram was obtained in AP projection.  Left and right renal arteries were widely patent.  Infrarenal abdominal aorta is widely patent.   There is a 70% narrowing of the proximal 3 cm of the right common iliac artery.  There is a stent just below this which is widely patent with no significant stenosis.  The left common iliac artery is widely patent.  The internal/external iliac arteries are patent bilaterally.  There is no significant stenosis of these.There is severe calcific disease of the common femoral artery bilaterally.  Both of these approaches 70% narrowing.  Next the pigtail catheter was pulled on distal of the aortic bifurcation and bilateral oblique views of the pelvis were obtained in a 30 degree RAO 30 degree LAO projection to confirm the above findings.  At this point bilateral lower extremity runoff views were obtained through the pigtail catheter.  In the left lower extremity the left common femoral artery again has severe calcific disease in about a 70% narrowing.  There is a left femoral to below-knee popliteal bypass which is widely patent.  The profunda femoris is patent.  The below-knee popliteal artery is patent with three-vessel runoff to the left foot.  In the right lower extremity, the right common femoral artery has severe calcific disease with about a 70% narrowing near the femoral bifurcation.  The profunda femoris and superficial femoral arteries are patent.  The right superficial femoral artery is diffusely diseased with multiple segments of exophytic calcific atherosclerosis multiple areas of 70 to 80% stenosis.  There is a stent in the right superficial femoral artery midportion which is widely patent with no in-stent restenosis.  There again is severe calcific disease below this involving all the way down to the above-knee popliteal artery.  The below-knee popliteal artery is patent without significant stenosis.  There is three-vessel runoff to  the right foot.  At this point the pigtail catheter was removed over a guidewire and a 5 French sheath left in place to be pulled in the holding area.  The patient  tolerated the procedure well and there were no complications.  Patient was taken the holding area in stable condition.  Operative management: Patient has multiple areas of stenosis in the right and left legs.  However, he currently has no symptoms of claudication rest pain or tissue loss.  He is able to walk a quarter to half a mile without any symptoms.  He becomes short of breath before he has any leg symptoms.  In light of this I do not believe an intervention would be in his best interest as this would probably take multiple interventions at multiple levels involving some element of risk for a patient that is currently asymptomatic.  We will schedule the patient for a follow-up office visit in 1 year with repeat ABIs and toe pressure.  Ruta Hinds, MD [facility] Office: [phone]

## 2019-07-23 NOTE — Interval H&P Note (Signed)
History and Physical Interval Note:  07/23/2019 7:35 AM  [name]  has presented today for surgery, with the diagnosis of stenotic stent.  The various methods of treatment have been discussed with the patient and family. After consideration of risks, benefits and other options for treatment, the patient has consented to  Procedure(s): ABDOMINAL AORTOGRAM W/LOWER EXTREMITY (N/A) as a surgical intervention.  The patient's history has been reviewed, patient examined, no change in status, stable for surgery.  I have reviewed the patient's chart and labs.  Questions were answered to the patient's satisfaction.     Ruta Hinds

## 2019-07-26 ENCOUNTER — Encounter (HOSPITAL_COMMUNITY): Payer: Self-pay | Admitting: Vascular Surgery

## 2019-09-30 ENCOUNTER — Ambulatory Visit: Payer: Medicare Other

## 2019-10-08 ENCOUNTER — Ambulatory Visit: Payer: Medicare Other

## 2019-10-09 ENCOUNTER — Ambulatory Visit: Payer: Medicare Other | Attending: Internal Medicine

## 2019-10-09 DIAGNOSIS — Z23 Encounter for immunization: Secondary | ICD-10-CM

## 2019-10-09 NOTE — Progress Notes (Signed)
   Covid-19 Vaccination Clinic  Name:  BREANNA WICK    MRN: [id number] DOB: 1946-04-20  10/09/2019  Mr. Rzepecki was observed post Covid-19 immunization for 15 minutes without incidence. He was provided with Vaccine Information Sheet and instruction to access the V-Safe system.   Mr. Birdsong was instructed to call 911 with any severe reactions post vaccine: Marland Kitchen Difficulty breathing  . Swelling of your face and throat  . A fast heartbeat  . A bad rash all over your body  . Dizziness and weakness    Immunizations Administered    Name Date Dose VIS Date Route   Pfizer COVID-19 Vaccine 10/09/2019  8:41 AM 0.3 mL 08/13/2019 Intramuscular   Manufacturer: [place]   Lot: [id number]   [place]: [id number]

## 2019-11-02 ENCOUNTER — Ambulatory Visit: Payer: Medicare Other | Attending: Internal Medicine

## 2019-11-02 ENCOUNTER — Ambulatory Visit: Payer: Medicare Other

## 2019-11-02 DIAGNOSIS — Z23 Encounter for immunization: Secondary | ICD-10-CM | POA: Insufficient documentation

## 2019-11-02 NOTE — Progress Notes (Signed)
   Covid-19 Vaccination Clinic  Name:  CHAN BOGGS    MRN: [id number] DOB: 1946/08/31  11/02/2019  Mr. Dalpiaz was observed post Covid-19 immunization for 15 minutes without incident. He was provided with Vaccine Information Sheet and instruction to access the V-Safe system.   Mr. Trundy was instructed to call 911 with any severe reactions post vaccine: Marland Kitchen Difficulty breathing  . Swelling of face and throat  . A fast heartbeat  . A bad rash all over body  . Dizziness and weakness   Immunizations Administered    Name Date Dose VIS Date Route   Pfizer COVID-19 Vaccine 11/02/2019  8:19 AM 0.3 mL 08/13/2019 Intramuscular   Manufacturer: [place]   Lot: [id number]   [place]: [id number]

## 2019-12-01 IMAGING — US US FNA BIOPSY THYROID 1ST LESION
1 series · 13 of 13 positions shown · non-contrast
Comparison: US 11/25/2017

INDICATION: Indeterminate thyroid nodule

Left inferior thyroid nodule
1.6 cm
EXAM:
ULTRASOUND GUIDED FINE NEEDLE ASPIRATION OF INDETERMINATE THYROID
NODULE
TECHNIQUE: Informed written consent was obtained from the patient after a
discussion of the risks, benefits and alternatives to treatment.
Questions regarding the procedure were encouraged and answered. A
timeout was performed prior to the initiation of the procedure.

[Series 1: us fna biopsy thyroid 1st lesion · 0.07mm/px · 13 acquisitions, 13 frames shown]
[im 1/13]
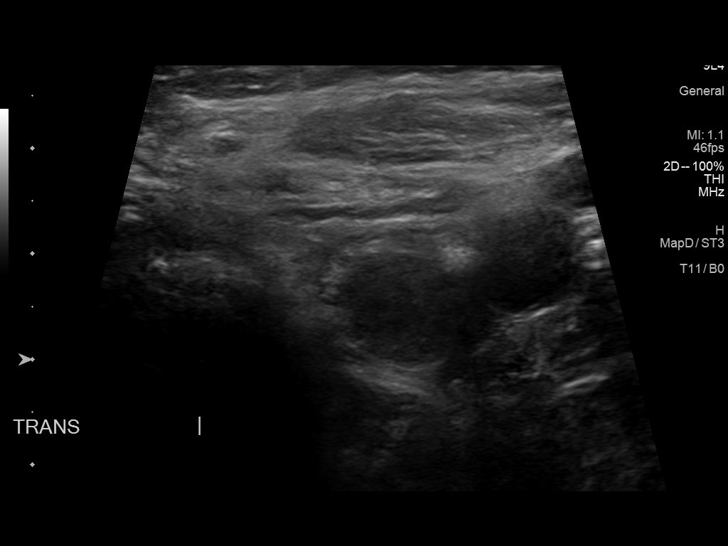
[im 2/13]
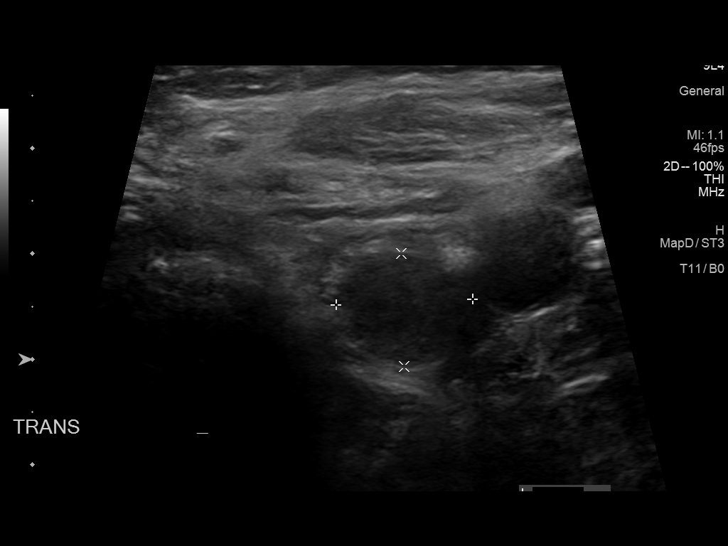
[im 3/13]
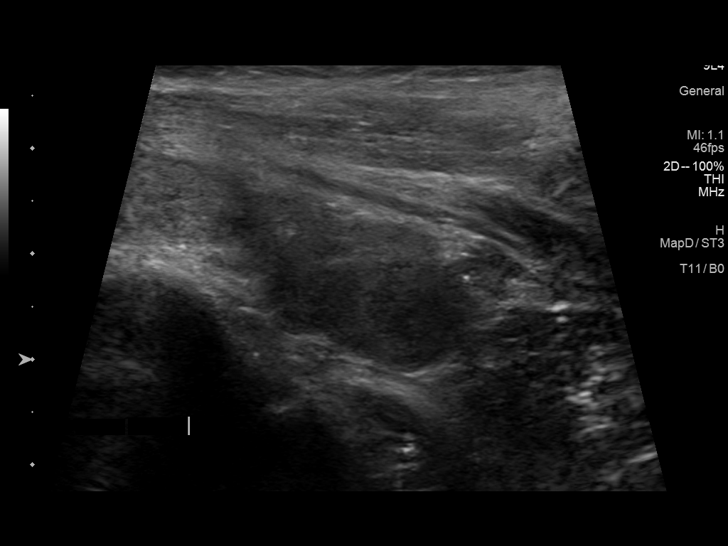
[im 4/13]
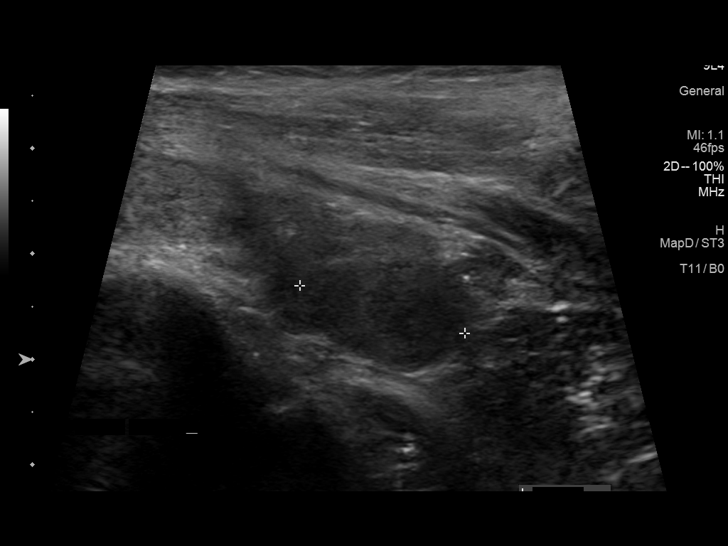
[im 5/13]
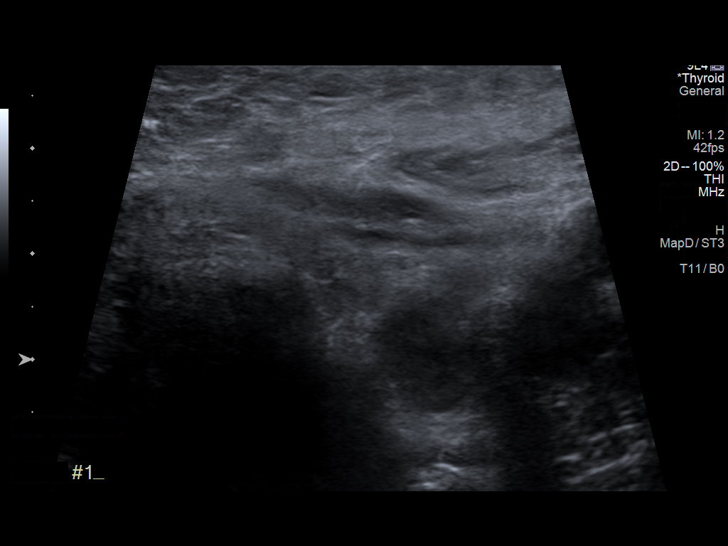
[im 6/13]
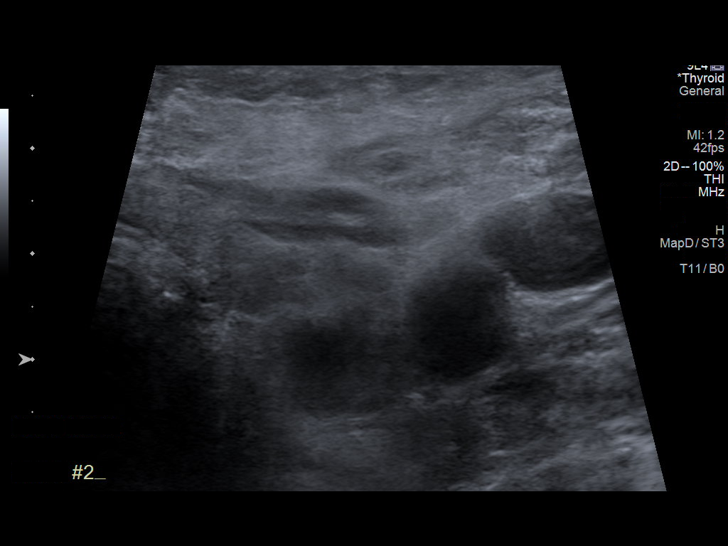
[im 7/13]
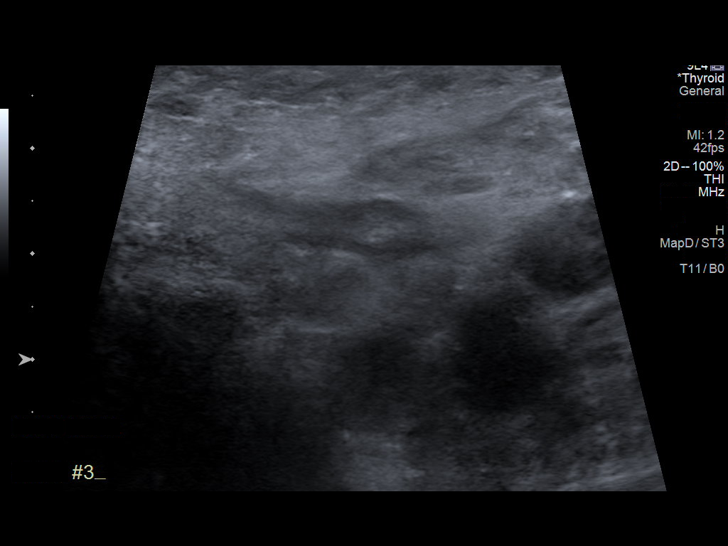
[im 8/13]
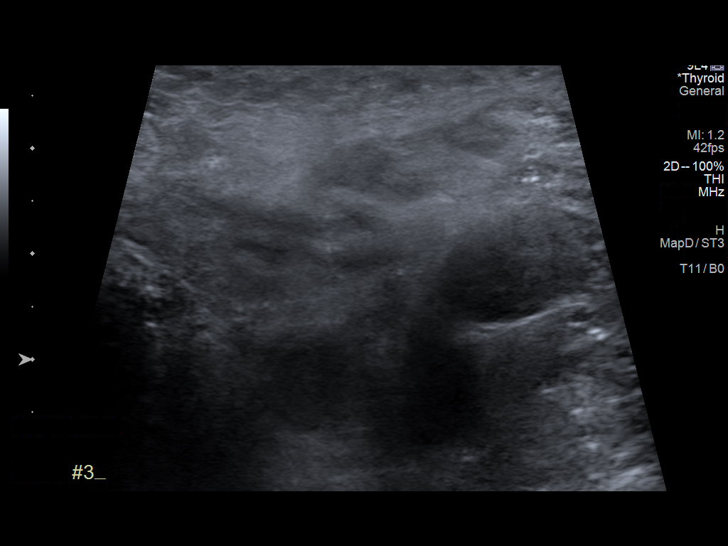
[im 9/13]
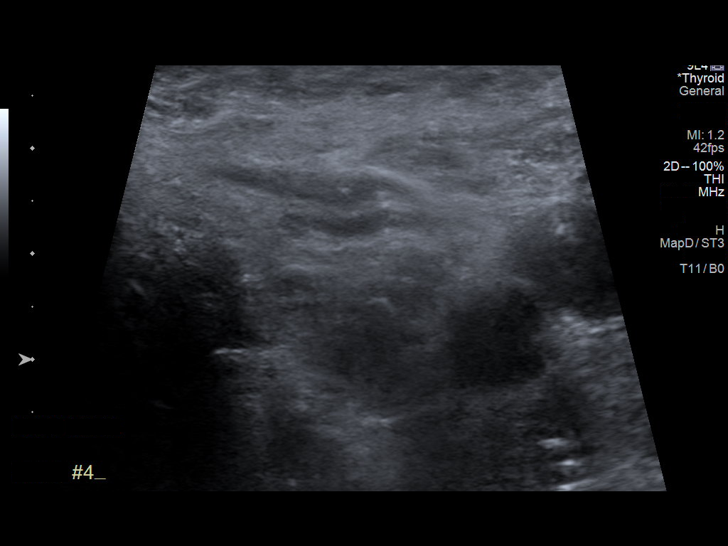
[im 10/13]
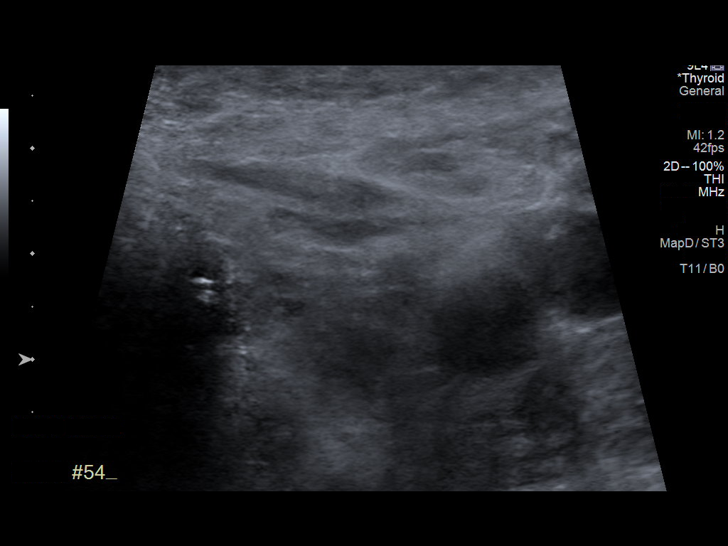
[im 11/13]
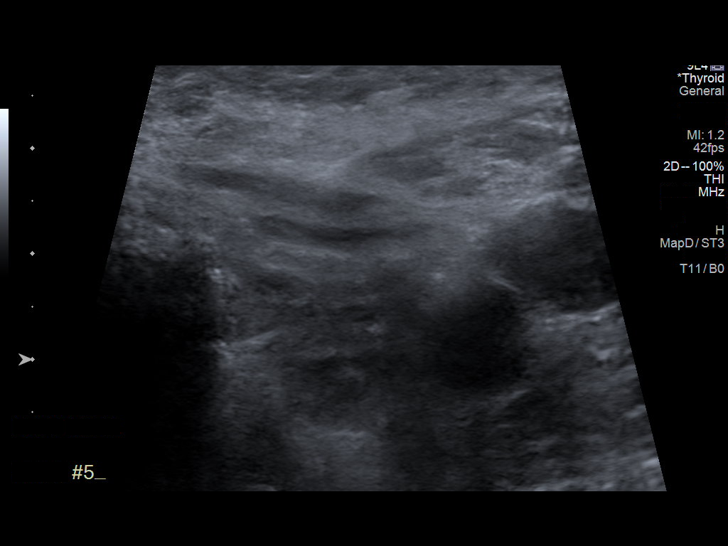
[im 12/13]
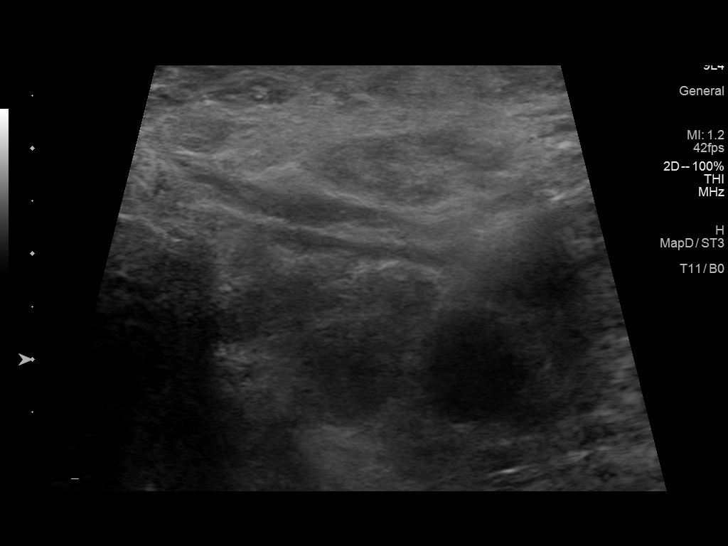
[im 13/13]
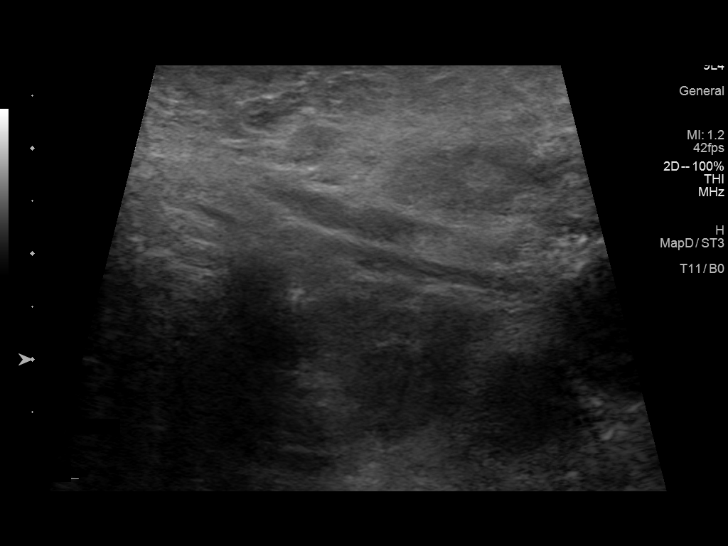

[13 of 13 positions shown; findings below may reference images not displayed]

Previous biopsy same nodule 01/22/2018

THYROID, FINE NEEDLE ASPIRATION LT LOBE LLP (SPECIMEN 1 OF 1,
COLLECTED ON 01/22/18):

SCANT FOLLICULAR EPITHELIUM PRESENT ([REDACTED] CATEGORY I).

MEDICATIONS:
5 cc 1% lidocaine

COMPLICATIONS:
None immediate.
Pre-procedural ultrasound scanning demonstrated unchanged size and
appearance of the indeterminate nodule within the left thyroid

The procedure was planned. The neck was prepped in the usual sterile
fashion, and a sterile drape was applied covering the operative
field. A timeout was performed prior to the initiation of the
procedure. Local anesthesia was provided with 1% lidocaine.

Under direct ultrasound guidance, 4 FNA biopsies were performed of
the left inferior thyroid nodule with a 25 gauge needle. Multiple
ultrasound images were saved for procedural documentation purposes.
The samples were prepared and submitted to pathology.

Limited post procedural scanning was negative for hematoma or
additional complication. Dressings were placed. The patient
tolerated the above procedures procedure well without immediate
postprocedural complication.
FINDINGS: Nodule reference number based on prior diagnostic ultrasound: 3

Maximum size: 1.6 cm

Location: Left; Inferior

ACR TI-RADS risk category: TR4 (4-6 points)

Reason for biopsy: nondiagnostic on prior biopsy

Ultrasound imaging confirms appropriate placement of the needles
within the thyroid nodule.
IMPRESSION: Technically successful ultrasound guided fine needle aspiration of
left inferior thyroid nodule

Read by

Sonal Ruggieri

## 2020-04-05 ENCOUNTER — Ambulatory Visit: Payer: Medicare Other | Admitting: Podiatry

## 2020-04-05 ENCOUNTER — Other Ambulatory Visit: Payer: Self-pay

## 2020-04-05 ENCOUNTER — Ambulatory Visit (INDEPENDENT_AMBULATORY_CARE_PROVIDER_SITE_OTHER): Payer: Medicare Other

## 2020-04-05 ENCOUNTER — Encounter: Payer: Self-pay | Admitting: Podiatry

## 2020-04-05 DIAGNOSIS — M79672 Pain in left foot: Secondary | ICD-10-CM

## 2020-04-05 DIAGNOSIS — M722 Plantar fascial fibromatosis: Secondary | ICD-10-CM

## 2020-04-05 NOTE — Progress Notes (Signed)
Subjective:   Patient ID: [name], male   DOB: 74 y.o.   MRN: [id number]   HPI Patient states having a lot of pain in the left heel and states it is been present for about 3 weeks and making it very hard to walk.  Patient's not been seen for a number of years and does not smoke likes to be active   Review of Systems  All other systems reviewed and are negative.       Objective:  Physical Exam Vitals and nursing note reviewed.  Constitutional:      Appearance: He is well-developed.  Pulmonary:     Effort: Pulmonary effort is normal.  Musculoskeletal:        General: Normal range of motion.  Skin:    General: Skin is warm.  Neurological:     Mental Status: He is alert.     Neurovascular status intact muscle strength found to be adequate range of motion within normal limits.  I did note moderate cavus foot structure and I did note exquisite discomfort left plantar fascia at the insertional point tendon the calcaneus with mild equinus     Assessment:  Acute fasciitis left cavus foot structure with.     Plan:  H&P x-rays reviewed today I did sterile prep and injected the plantar fascial left 3 mg Kenalog 5 mg Xylocaine gave instructions on stretching and shoe gear modifications and will be seen back as needed.  I educated him on other treatments we may have to consider in future

## 2020-04-06 ENCOUNTER — Other Ambulatory Visit: Payer: Self-pay | Admitting: Podiatry

## 2020-04-06 DIAGNOSIS — M722 Plantar fascial fibromatosis: Secondary | ICD-10-CM

## 2020-04-25 ENCOUNTER — Ambulatory Visit: Payer: Self-pay | Attending: Internal Medicine

## 2020-04-25 DIAGNOSIS — Z23 Encounter for immunization: Secondary | ICD-10-CM

## 2020-04-25 NOTE — Progress Notes (Signed)
   Covid-19 Vaccination Clinic  Name:  [name]    MRN: [id number] DOB: 06-24-1946  04/25/2020  [name] was observed post Covid-19 immunization for 15 minutes without incident. He was provided with Vaccine Information Sheet and instruction to access the V-Safe system.   [name] was instructed to call 911 with any severe reactions post vaccine: Marland Kitchen Difficulty breathing  . Swelling of face and throat  . A fast heartbeat  . A bad rash all over body  . Dizziness and weakness

## 2020-08-15 ENCOUNTER — Telehealth: Payer: Self-pay | Admitting: Podiatry

## 2020-08-15 NOTE — Telephone Encounter (Signed)
Patient wants to know if he can receive larger dose for steroid injection, Please advise

## 2020-08-16 NOTE — Telephone Encounter (Signed)
yes

## 2020-08-17 ENCOUNTER — Ambulatory Visit: Payer: Medicare Other | Admitting: Podiatry

## 2020-08-17 ENCOUNTER — Encounter: Payer: Self-pay | Admitting: Podiatry

## 2020-08-17 ENCOUNTER — Other Ambulatory Visit: Payer: Self-pay

## 2020-08-17 DIAGNOSIS — M722 Plantar fascial fibromatosis: Secondary | ICD-10-CM

## 2020-08-17 MED ORDER — TRIAMCINOLONE ACETONIDE 10 MG/ML IJ SUSP
10.0000 mg | Freq: Once | INTRAMUSCULAR | Status: AC
  Administered 2020-08-17: 10 mg

## 2020-08-21 NOTE — Progress Notes (Signed)
Subjective:   Patient ID: [name], male   DOB: 74 y.o.   MRN: [id number]   HPI Patient states his heel has started to get sore again   ROS      Objective:  Physical Exam  Neurovascular status intact with inflammation pain of the left plantar fascia insertional point tendon calcaneus     Assessment:  Acute plantar fasciitis left     Plan:  Sterile prep injected the fascial band at insertion calcaneus 3 mg Dexasone Kenalog 5 mg Xylocaine applied bracing and instructed on continue supportive shoe gear usage

## 2020-09-26 NOTE — Progress Notes (Signed)
Cardiology Office Note:    Date:  09/28/2020   ID:  [name], DOB 17-Sep-1945, MRN [id number]  PCP:  Georgann Housekeeper, MD  Cardiologist:  Lesleigh Noe, MD   Referring MD: Georgann Housekeeper, MD   Chief Complaint  Patient presents with  . Coronary Artery Disease  . Congestive Heart Failure    History of Present Illness:    [name] is a 75 y.o. 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   [name] is doing well.  He does not complain of active claudication despite having significant obstruction in the right lower extremity based on recent Doppler studies.  He is relatively active.  He has not limited.  He denies orthopnea, PND, syncope, and peripheral edema.  He has not had PND.  He denies palpitations.  He rarely has angina.  The most recent echocardiogram done in 2020 demonstrated an EF of 45 to 50%.  He does not smoke.  Most recent hemoglobin A1c was less than 6.5.  Past Medical History:  Diagnosis Date  . Abdominal pain   . Afib (HCC)   . Arthritis    in back  . Bruises easily   . Change in voice   . Chest pain   . CHF (congestive heart failure) (HCC)   . Coronary artery disease   . Cough   . Diabetes mellitus Age 71  . Diarrhea   . Fungal infection of toenail 10-2014   left great toe  . Generalized headaches   . Heart attack (HCC)   . Hx of CABG 1993  . Hyperlipidemia   . Hypertension   . Hyperthyroidism   . Ischemic cardiomyopathy   . Leg pain 03/17/2009   With walking  . Leg swelling   . Myocardial infarction (HCC)   . PAD (peripheral artery disease) (HCC)    Right SFA stent  . 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HCC)    repaired left side Dr Madilyn Fireman 2008  . PVD (peripheral vascular disease) (HCC)   . Rash   . Reflux   . Retinopathy   . Trouble swallowing   . Tubular adenoma   .  Weakness   . Weight loss, unintentional   . Wheezing     Past Surgical History:  Procedure Laterality Date  . ABDOMINAL AORTAGRAM N/A 11/11/2014   Procedure: ABDOMINAL Ronny Flurry;  Surgeon: Sherren Kerns, MD;  Location: [facility] CATH LAB;  Service: Cardiovascular;  Laterality: N/A;  . ABDOMINAL AORTOGRAM W/LOWER EXTREMITY Bilateral 07/23/2019   Procedure: ABDOMINAL AORTOGRAM W/LOWER EXTREMITY;  Surgeon: Sherren Kerns, MD;  Location: [facility] INVASIVE CV LAB;  Service: Cardiovascular;  Laterality: Bilateral;  . BACK SURGERY  1994   L5 discectomy  . CATARACT EXTRACTION, BILATERAL    . CORONARY ARTERY BYPASS GRAFT  1993-2001   X2  . PR VEIN BYPASS GRAFT,AORTO-FEM-POP  2008    Current Medications: Current Meds  Medication Sig  . ACCU-CHEK AVIVA PLUS test strip 1 each daily.  Marland Kitchen acetaminophen (TYLENOL) 500 MG tablet Take 1,000 mg by mouth 2 (two) times daily as needed for pain (pain/headache).  Marland Kitchen atorvastatin (LIPITOR) 40 MG tablet Take 40 mg by mouth daily.  . Blood Glucose Monitoring Suppl (ACCU-CHEK AVIVA PLUS) W/DEVICE KIT   . carvedilol (COREG) 25 MG tablet Take 25 mg by mouth 2 (two) times daily with a meal.  . clopidogrel (PLAVIX)  75 MG tablet Take 75 mg by mouth daily.  . furosemide (LASIX) 80 MG tablet Take 120 mg by mouth daily.   . metFORMIN (GLUCOPHAGE) 500 MG tablet Take 500 mg by mouth 2 (two) times daily with a meal.  . metFORMIN (GLUCOPHAGE-XR) 500 MG 24 hr tablet Take 500 mg by mouth 2 (two) times daily.  . metroNIDAZOLE (METROGEL) 1 % gel Apply 1 application topically daily as needed (rosacia).   Marland Kitchen NIFEdipine (PROCARDIA XL/ADALAT-CC) 90 MG 24 hr tablet Take 1 tablet by mouth once a day  . nitroGLYCERIN (NITROSTAT) 0.4 MG SL tablet Place 0.4 mg under the tongue every 5 (five) minutes as needed for chest pain.  . potassium chloride SA (K-DUR,KLOR-CON) 20 MEQ tablet Take 20 mEq by mouth daily.  Marland Kitchen telmisartan (MICARDIS) 80 MG tablet Take 80 mg by mouth daily.  Marland Kitchen warfarin  (COUMADIN) 5 MG tablet Take 5 mg by mouth daily.      Allergies:   Amiodarone, Erythromycin, Methimazole, Other, and Penicillins   Social History   Socioeconomic History  . Marital status: Married    Spouse name: Not on file  . Number of children: Not on file  . Years of education: Not on file  . Highest education level: Not on file  Occupational History  . Not on file  Tobacco Use  . Smoking status: Former Smoker    Types: Cigarettes    Quit date: 09/03/1983    Years since quitting: 37.0  . Smokeless tobacco: Never Used  Vaping Use  . Vaping Use: Never used  Substance and Sexual Activity  . Alcohol use: No  . Drug use: No  . Sexual activity: Not on file  Other Topics Concern  . Not on file  Social History Narrative  . Not on file   Social Determinants of Health   Financial Resource Strain: Not on file  Food Insecurity: Not on file  Transportation Needs: Not on file  Physical Activity: Not on file  Stress: Not on file  Social Connections: Not on file     Family History: The patient's family history includes Cancer in his brother, father, maternal aunt, and mother; Deep vein thrombosis in his brother; Diabetes in his daughter, father, and mother; Heart disease in his daughter, mother, and another family member; Hyperlipidemia in his brother, daughter, and mother; Hypertension in his brother, daughter, father, and mother; Peripheral vascular disease in his daughter.  ROS:   Please see the history of present illness.    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  Had COVID-19.  He is vaccinated and boosted.  His wife is done really well after kidney transplant 2019.   All other systems reviewed and are negative.  EKGs/Labs/Other Studies Reviewed:    The following studies were reviewed today: Lower extremity vascular Doppler study October 2020: Summary:  Right: 100 - 99% in-stent stenosis as well as proximal to stent.   Left: Patent left femoral to below knee popliteal bypass graft  with no  visualized stenosis.   ECHOCARDIOGRAPHY 2020: IMPRESSIONS    1. The left ventricle has mildly reduced systolic function, with an  ejection fraction of 45-50%. The cavity size was mildly dilated. There is  moderately increased left ventricular wall thickness. Left ventricular  diastolic function could not be  evaluated secondary to atrial fibrillation.  2. The right ventricle has normal systolic function. The cavity was  normal. Right ventricular systolic pressure is mildly elevated with an  estimated pressure of 44.5 mmHg.  3. Left  atrial size was severely dilated.  4. Right atrial size was moderately dilated.  5. The mitral valve is abnormal. There is moderate mitral annular  calcification present.  6. The tricuspid valve is grossly normal.  7. The aortic valve is tricuspid. Mild calcification of the aortic valve.  No stenosis of the aortic valve.  8. The aorta is abnormal unless otherwise noted.  9. There is mild dilatation of the ascending aorta measuring 39 mm.  10. Akinesis of the distal inferior wall and hypokinesis of the distal  septum; overall mild LV dysfunction; moderate LVH; mild LVE; biatrial  enlargement; mild MR and TR; mild pulmonary hypertension.   EKG:  EKG atrial fibrillation, left bundle branch block, nonspecific T wave abnormality.  Compared to the prior tracing the rate is slower.  Recent Labs: No results found for requested labs within last 8760 hours.  Recent Lipid Panel No results found for: CHOL, TRIG, HDL, CHOLHDL, VLDL, LDLCALC, LDLDIRECT  Physical Exam:    VS:  BP 128/66   Pulse (!) 52   Ht $R'5\' 10"'tx$  (1.778 m)   Wt 238 lb (108 kg)   BMI 34.15 kg/m     Wt Readings from Last 3 Encounters:  09/28/20 238 lb (108 kg)  07/23/19 245 lb (111.1 kg)  06/24/19 232 lb 11.2 oz (105.6 kg)     GEN: Obese. No acute distress HEENT: Normal NECK: No JVD. LYMPHATICS: No lymphadenopathy CARDIAC: No murmur. IIRR without gallop, or  edema. VASCULAR:  Normal Pulses. No bruits. RESPIRATORY:  Clear to auscultation without rales, wheezing or rhonchi  ABDOMEN: Soft, non-tender, non-distended, No pulsatile mass, MUSCULOSKELETAL: No deformity  SKIN: Warm and dry NEUROLOGIC:  Alert and oriented x 3 PSYCHIATRIC:  Normal affect   ASSESSMENT:    1. Coronary artery disease involving coronary bypass graft of native heart without angina pectoris   2. PAD (peripheral artery disease) ([place])   3. Chronic systolic HF (heart failure) (HCC)   4. Paroxysmal atrial fibrillation ([place])   5. Essential hypertension   6. Chronic anticoagulation   7. Educated about COVID-19 virus infection    PLAN:    In order of problems listed above:  1. Secondary prevention reviewed as outlined below. 2. Followed by Dr. Oneida Alar.  Denies claudication. 3. Low normal to mildly depressed/mid range LVEF.  Continue heart failure therapy including carvedilol and Micardis.  Consider SGLT2 therapy given his diabetes.  This will provide both kidney and heart protection. 4. Continuous atrial fibrillation.  Continue Coumadin. 5. Blood pressure is very well controlled on the current regimen.  Low-sodium diet 6. Because of increasing bleeding risk as he ages, will decrease intensity of Plavix to 75 mg on Monday, Wednesday, and Friday.  Continue Coumadin dosing as is per the Coumadin clinic. 7. Vaccinated and boosted.  Practicing social distancing.  Overall education and awareness concerning primary/secondary risk prevention was discussed in detail: LDL less than 70, hemoglobin A1c less than 7, blood pressure target less than 130/80 mmHg, >150 minutes of moderate aerobic activity per week, avoidance of smoking, weight control (via diet and exercise), and continued surveillance/management of/for obstructive sleep apnea.    Medication Adjustments/Labs and Tests Ordered: Current medicines are reviewed at length with the patient today.  Concerns regarding medicines are  outlined above.  Orders Placed This Encounter  Procedures  . EKG 12-Lead   No orders of the defined types were placed in this encounter.   There are no Patient Instructions on file for this visit.   Signed,  Sinclair Grooms, MD  09/28/2020 8:45 AM    [place]

## 2020-09-28 ENCOUNTER — Encounter: Payer: Self-pay | Admitting: Interventional Cardiology

## 2020-09-28 ENCOUNTER — Ambulatory Visit: Payer: Medicare Other | Admitting: Interventional Cardiology

## 2020-09-28 ENCOUNTER — Other Ambulatory Visit: Payer: Self-pay

## 2020-09-28 VITALS — BP 128/66 | HR 52 | Ht 70.0 in | Wt 238.0 lb

## 2020-09-28 DIAGNOSIS — I25810 Atherosclerosis of coronary artery bypass graft(s) without angina pectoris: Secondary | ICD-10-CM

## 2020-09-28 DIAGNOSIS — I739 Peripheral vascular disease, unspecified: Secondary | ICD-10-CM

## 2020-09-28 DIAGNOSIS — I5022 Chronic systolic (congestive) heart failure: Secondary | ICD-10-CM | POA: Diagnosis not present

## 2020-09-28 DIAGNOSIS — I480 Paroxysmal atrial fibrillation: Secondary | ICD-10-CM | POA: Diagnosis not present

## 2020-09-28 DIAGNOSIS — Z7901 Long term (current) use of anticoagulants: Secondary | ICD-10-CM

## 2020-09-28 DIAGNOSIS — Z7189 Other specified counseling: Secondary | ICD-10-CM

## 2020-09-28 DIAGNOSIS — I10 Essential (primary) hypertension: Secondary | ICD-10-CM

## 2020-09-28 MED ORDER — CLOPIDOGREL BISULFATE 75 MG PO TABS: 75.0000 mg | ORAL_TABLET | ORAL | 3 refills | Status: DC

## 2020-09-28 NOTE — Patient Instructions (Addendum)
Medication Instructions:  1) DECREASE Plavix to 75mg  on Monday, Wednesday and Friday.  *If you need a refill on your cardiac medications before your next appointment, please call your pharmacy*   Lab Work: None If you have labs (blood work) drawn today and your tests are completely normal, you will receive your results only by: Marland Kitchen MyChart Message (if you have MyChart) OR . A paper copy in the mail If you have any lab test that is abnormal or we need to change your treatment, we will call you to review the results.   Testing/Procedures: None   Follow-Up: At [facility], you and your health needs are our priority.  As part of our continuing mission to provide you with exceptional heart care, we have created designated Provider Care Teams.  These Care Teams include your primary Cardiologist (physician) and Advanced Practice Providers (APPs -  Physician Assistants and Nurse Practitioners) who all work together to provide you with the care you need, when you need it.  We recommend signing up for the patient portal called "MyChart".  Sign up information is provided on this After Visit Summary.  MyChart is used to connect with patients for Virtual Visits (Telemedicine).  Patients are able to view lab/test results, encounter notes, upcoming appointments, etc.  Non-urgent messages can be sent to your provider as well.   To learn more about what you can do with MyChart, go to [email or web address].    Your next appointment:   1 year(s)  The format for your next appointment:   In Person  Provider:   You may see Sinclair Grooms, MD or one of the following Advanced Practice Providers on your designated Care Team:    Kathyrn Drown, NP    Other Instructions

## 2021-01-22 ENCOUNTER — Other Ambulatory Visit: Payer: Self-pay

## 2021-01-22 ENCOUNTER — Ambulatory Visit: Payer: Medicare Other | Attending: Internal Medicine

## 2021-01-22 ENCOUNTER — Other Ambulatory Visit (HOSPITAL_BASED_OUTPATIENT_CLINIC_OR_DEPARTMENT_OTHER): Payer: Self-pay

## 2021-01-22 DIAGNOSIS — Z23 Encounter for immunization: Secondary | ICD-10-CM

## 2021-01-22 MED ORDER — PFIZER-BIONT COVID-19 VAC-TRIS 30 MCG/0.3ML IM SUSP
INTRAMUSCULAR | 0 refills | Status: AC
  Filled 2021-01-22: qty 0.3, 1d supply, fill #0

## 2021-01-22 NOTE — Progress Notes (Signed)
   Covid-19 Vaccination Clinic  Name:  CATARINO VOLD    MRN: [id number] DOB: 1946/01/13  01/22/2021  Mr. Kadlec was observed post Covid-19 immunization for 15 minutes without incident. He was provided with Vaccine Information Sheet and instruction to access the V-Safe system.   Mr. Diop was instructed to call 911 with any severe reactions post vaccine: Marland Kitchen Difficulty breathing  . Swelling of face and throat  . A fast heartbeat  . A bad rash all over body  . Dizziness and weakness   Immunizations Administered    Name Date Dose VIS Date Route   PFIZER Comrnaty(Gray TOP) Covid-19 Vaccine 01/22/2021 12:24 PM 0.3 mL 08/10/2020 Intramuscular   Manufacturer: [place]   Lot: [id number]   NDC: [phone]

## 2021-02-09 ENCOUNTER — Other Ambulatory Visit (HOSPITAL_BASED_OUTPATIENT_CLINIC_OR_DEPARTMENT_OTHER): Payer: Self-pay

## 2021-04-06 ENCOUNTER — Ambulatory Visit: Payer: Medicare Other | Admitting: Podiatry

## 2021-04-06 ENCOUNTER — Other Ambulatory Visit: Payer: Self-pay

## 2021-04-06 DIAGNOSIS — L02612 Cutaneous abscess of left foot: Secondary | ICD-10-CM

## 2021-04-06 DIAGNOSIS — L03032 Cellulitis of left toe: Secondary | ICD-10-CM

## 2021-04-06 MED ORDER — DOXYCYCLINE HYCLATE 100 MG PO TABS: 100.0000 mg | ORAL_TABLET | Freq: Two times a day (BID) | ORAL | 0 refills | Status: DC

## 2021-04-06 NOTE — Progress Notes (Signed)
Subjective:   Patient ID: [name], male   DOB: 75 y.o.   MRN: [id number]   HPI Patient states he developed redness and swelling to his left big toe and that its been very sore and recently has developed more redness and feels like he has infection.  Had a probable injury to this but cannot identify specific event and has had no systemic signs of infection when questioned   ROS      Objective:  Physical Exam  Neurovascular status intact with redness of the left hallux nail bed with a traumatized left hallux nail and proximal edema erythema extending to the inner phalangeal joint and not proximal to this.  Very painful when I pressed on the proximal nail fold     Assessment:  Abscess with low-grade cellulitis left hallux localized not going past the inner phalangeal joint with traumatized nailbed     Plan:  H&P educated them on condition and discussed treatment options.  Due to the intensity of it I did anesthetized the left hallux 60 mg like Marcaine mixture I remove the nail I then identified the proximal nail fold I flushed it out I removed necrotic tissue did not note bone tendon or subcutaneous exposure applied sterile dressing placed on doxycycline gave instructions for soaks and strict instructions to call us if any issues were to occur.  Any systemic signs of infection he is to go straight to the emergency room

## 2021-04-06 NOTE — Patient Instructions (Signed)

## 2021-04-30 ENCOUNTER — Telehealth: Payer: Self-pay | Admitting: Interventional Cardiology

## 2021-04-30 DIAGNOSIS — I5022 Chronic systolic (congestive) heart failure: Secondary | ICD-10-CM

## 2021-04-30 DIAGNOSIS — I480 Paroxysmal atrial fibrillation: Secondary | ICD-10-CM

## 2021-04-30 DIAGNOSIS — R0602 Shortness of breath: Secondary | ICD-10-CM

## 2021-04-30 NOTE — Telephone Encounter (Signed)
Pt c/o Shortness Of Breath: STAT if SOB developed within the last 24 hours or pt is noticeably SOB on the phone  1. Are you currently SOB (can you hear that pt is SOB on the phone)? Patient says he is. It is not noticeable on the phone   2. How long have you been experiencing SOB? About two weeks  3. Are you SOB when sitting or when up moving around? It can happen any time. Sometimes it wakes him up at night   4. Are you currently experiencing any other symptoms? fatigue

## 2021-04-30 NOTE — Telephone Encounter (Signed)
Pt with intermittent SOB x 2 weeks.  Not associated with anything like exertion.  Occurs with or without movement.  Has even woke him up from sleep at times and can occur even if he's just sitting there watching TV.  Lasts anywhere from 65mns-1.5 hours.  Swelling in BLE that is worse than usual.  Denies increased salt in the diet.  Denies CP, HA, lightheadedness or dizziness.  Only other issue is he is consistently fatigued.  Inquired if he feels like he may be going in and out of AF and he feels he might be.  Symptoms have occurred on a daily basis the last two weeks.  BP 130s/50s, HR mid 50s.  Offered appt tomorrow with Dr. SMarlou Porch DOD and pt declined.  Scheduled pt to come in Thursday to see Dr. STamala Julian

## 2021-05-01 NOTE — Telephone Encounter (Signed)
Needs CBC, BNP, CMET, and d-dimer. Needs ECG and may need monitor.

## 2021-05-01 NOTE — Telephone Encounter (Signed)
Spoke with pt and he will come by tomorrow for lab work.  We will do EKG at appt on Thursday and set up monitor if needed.

## 2021-05-02 ENCOUNTER — Other Ambulatory Visit: Payer: Self-pay

## 2021-05-02 ENCOUNTER — Other Ambulatory Visit: Payer: Medicare Other | Admitting: *Deleted

## 2021-05-02 DIAGNOSIS — R0602 Shortness of breath: Secondary | ICD-10-CM

## 2021-05-02 DIAGNOSIS — I480 Paroxysmal atrial fibrillation: Secondary | ICD-10-CM

## 2021-05-02 DIAGNOSIS — I5022 Chronic systolic (congestive) heart failure: Secondary | ICD-10-CM

## 2021-05-02 LAB — D-DIMER, QUANTITATIVE: D-DIMER: 0.3 mg/L FEU (ref 0.00–0.49)

## 2021-05-02 NOTE — Progress Notes (Signed)
Cardiology Office Note:    Date:  05/03/2021   ID:  [name], DOB 02-16-1946, MRN [id number]  PCP:  Wenda Low, MD  Cardiologist:  Sinclair Grooms, MD   Referring MD: Wenda Low, MD   No chief complaint on file.   History of Present Illness:    [name] is a 75 y.o. 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   Call in with concern as follows point 04/30/2021::  "Pt with intermittent SOB x 2 weeks.  Not associated with anything like exertion.  Occurs with or without movement.  Has even woke him up from sleep at times and can occur even if he's just sitting there watching TV.  Lasts anywhere from 25mns-1.5 hours.  Swelling in BLE that is worse than usual.  Denies increased salt in the diet.  Denies CP, HA, lightheadedness or dizziness.  Only other issue is he is consistently fatigued.  Inquired if he feels like he may be going in and out of AF and he feels he might be.  Symptoms have occurred on a daily basis the last two weeks.  BP 130s/50s, HR mid 50s.  Offered appt tomorrow with Dr. SMarlou Porch DOD and pt declined.  Scheduled pt to come in Thursday to see Dr. STamala Julian "   Chest gets little tight when he is short of breath.  Episodes are intermittent and not necessarily precipitated by physical activity.  He denies orthopnea.  He has had lower extremity swelling according to his wife.  He does not add salt but does not try to limit sodium intake in his diet.  He has not had syncope.  Past Medical History:  Diagnosis Date   Abdominal pain    Afib ([facility]    Arthritis    in back   Bruises easily    Change in voice    Chest pain    CHF (congestive heart failure) (HCC)    Coronary artery disease    Cough    Diabetes mellitus Age 75  Diarrhea    Fungal infection of toenail 10-2014   left great toe    Generalized headaches    [place]    Hx of CABG 1993   Hyperlipidemia    Hypertension    Hyperthyroidism    Ischemic cardiomyopathy    Leg pain 03/17/2009   With walking   Leg swelling    Myocardial infarction ([facility]    PAD (peripheral artery disease) (HCC)    Right SFA stent   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[facility]    repaired left side Dr HAmedeo Plenty2008   PVD (peripheral vascular disease) ([facility]    Rash    Reflux    Retinopathy    Trouble swallowing    Tubular adenoma    Weakness    Weight loss, unintentional    Wheezing     Past Surgical History:  Procedure Laterality Date   ABDOMINAL AORTAGRAM N/A 11/11/2014   Procedure: ABDOMINAL AMaxcine Ham  Surgeon: CElam Dutch MD;  [facility];  Service: Cardiovascular;  Laterality: N/A;   ABDOMINAL AORTOGRAM W/LOWER EXTREMITY Bilateral 07/23/2019   Procedure: ABDOMINAL AORTOGRAM W/LOWER EXTREMITY;  Surgeon: FElam Dutch MD;  [facility];  Service: Cardiovascular;  Laterality: Bilateral;   BACK SURGERY  1994   L5 discectomy   CATARACT EXTRACTION, BILATERAL  CORONARY ARTERY BYPASS GRAFT  1993-2001   X2   PR VEIN BYPASS GRAFT,AORTO-FEM-POP  2008    Current Medications: Current Meds  Medication Sig   ACCU-CHEK AVIVA PLUS test strip 1 each daily.   acetaminophen (TYLENOL) 500 MG tablet Take 1,000 mg by mouth 2 (two) times daily as needed for pain (pain/headache).   atorvastatin (LIPITOR) 40 MG tablet Take 40 mg by mouth daily.   Blood Glucose Monitoring Suppl (ACCU-CHEK AVIVA PLUS) W/DEVICE KIT    carvedilol (COREG) 25 MG tablet Take 25 mg by mouth 2 (two) times daily with a meal.   clopidogrel (PLAVIX) 75 MG tablet Take 75 mg by mouth daily.   COVID-19 mRNA Vac-TriS, Pfizer, (PFIZER-BIONT COVID-19 VAC-TRIS) SUSP injection Inject into the muscle.   doxycycline (VIBRA-TABS) 100 MG tablet Take 1 tablet (100 mg total) by mouth 2 (two) times daily.   furosemide (LASIX) 80 MG tablet Take 120 mg by  mouth daily.    metFORMIN (GLUCOPHAGE) 500 MG tablet Take 500 mg by mouth 2 (two) times daily with a meal.   metFORMIN (GLUCOPHAGE-XR) 500 MG 24 hr tablet Take 500 mg by mouth 2 (two) times daily.   metroNIDAZOLE (METROGEL) 1 % gel Apply 1 application topically daily as needed (rosacia).    NIFEdipine (PROCARDIA XL/ADALAT-CC) 90 MG 24 hr tablet Take 1 tablet by mouth once a day   nitroGLYCERIN (NITROSTAT) 0.4 MG SL tablet Place 0.4 mg under the tongue every 5 (five) minutes as needed for chest pain.   potassium chloride SA (K-DUR,KLOR-CON) 20 MEQ tablet Take 20 mEq by mouth daily.   telmisartan (MICARDIS) 80 MG tablet Take 80 mg by mouth daily.   warfarin (COUMADIN) 5 MG tablet Take 5 mg by mouth daily.    [DISCONTINUED] spironolactone (ALDACTONE) 25 MG tablet Take 0.5 tablets (12.5 mg total) by mouth daily.     Allergies:   Amiodarone, Erythromycin, Methimazole, Other, and Penicillins   Social History   Socioeconomic History   Marital status: Married    Spouse name: Not on file   Number of children: Not on file   Years of education: Not on file   Highest education level: Not on file  Occupational History   Not on file  Tobacco Use   Smoking status: Former    Types: Cigarettes    Quit date: 09/03/1983    Years since quitting: 37.6   Smokeless tobacco: Never  Vaping Use   Vaping Use: Never used  Substance and Sexual Activity   Alcohol use: No   Drug use: No   Sexual activity: Not on file  Other Topics Concern   Not on file  Social History Narrative   Not on file   Social Determinants of Health   Financial Resource Strain: Not on file  Food Insecurity: Not on file  Transportation Needs: Not on file  Physical Activity: Not on file  Stress: Not on file  Social Connections: Not on file     Family History: The patient's family history includes Cancer in his brother, father, maternal aunt, and mother; Deep vein thrombosis in his brother; Diabetes in his daughter, father,  and mother; Heart disease in his daughter, mother, and another family member; Hyperlipidemia in his brother, daughter, and mother; Hypertension in his brother, daughter, father, and mother; Peripheral vascular disease in his daughter.  ROS:   Please see the history of present illness.    For 2 nights in a row he has slept relatively well.  Occasionally he awakens  short of breath sits up.  Sitting does not seem to improve breathing.  Appetite is decreased according to the wife.  Weight is down 10 pounds without attempting to lose weight.  All other systems reviewed and are negative.  EKGs/Labs/Other Studies Reviewed:    The following studies were reviewed today:  Laboratory test performed 05/02/2021: D-dimer 0.3 Hemoglobin 11.2 N-terminal proBNP 1851 Alkaline phosphatase 125 Potassium 4.1, creatinine 1.23, BUN 15.   ECHOCARDIOGRAM 05/2019: IMPRESSIONS     1. The left ventricle has mildly reduced systolic function, with an  ejection fraction of 45-50%. The cavity size was mildly dilated. There is  moderately increased left ventricular wall thickness. Left ventricular  diastolic function could not be  evaluated secondary to atrial fibrillation.   2. The right ventricle has normal systolic function. The cavity was  normal. Right ventricular systolic pressure is mildly elevated with an  estimated pressure of 44.5 mmHg.   3. Left atrial size was severely dilated.   4. Right atrial size was moderately dilated.   5. The mitral valve is abnormal. There is moderate mitral annular  calcification present.   6. The tricuspid valve is grossly normal.   7. The aortic valve is tricuspid. Mild calcification of the aortic valve.  No stenosis of the aortic valve.   8. The aorta is abnormal unless otherwise noted.   9. There is mild dilatation of the ascending aorta measuring 39 mm.  10. Akinesis of the distal inferior wall and hypokinesis of the distal  septum; overall mild LV dysfunction;  moderate LVH; mild LVE; biatrial  enlargement; mild MR and TR; mild pulmonary hypertension.   EKG:  EKG atrial fib, slow ventricular response, and left bundle branch block.  No change compared to the prior tracing done in January.  Recent Labs: 05/02/2021: ALT 15; BUN 15; Creatinine, Ser 1.23; Hemoglobin 11.2; NT-Pro BNP 1,851; Platelets 173; Potassium 4.1; Sodium 139  Recent Lipid Panel No results found for: CHOL, TRIG, HDL, CHOLHDL, VLDL, LDLCALC, LDLDIRECT  Physical Exam:    VS:  BP (!) 148/72   Pulse (!) 57   Ht _0  (1.778 m)   Wt 228 lb 9.6 oz (103.7 kg)   SpO2 98%   BMI 32.80 kg/m     Wt Readings from Last 3 Encounters:  05/03/21 228 lb 9.6 oz (103.7 kg)  09/28/20 238 lb (108 kg)  07/23/19 245 lb (111.1 kg)     GEN: Obese.  Weight is actually down 10 pounds compared to prior.. No acute distress HEENT: Normal NECK: No JVD. LYMPHATICS: No lymphadenopathy CARDIAC: Irregularly irregular murmur. RRR no gallop, or edema. VASCULAR:  Normal Pulses. No bruits. RESPIRATORY: Basilar rales bilaterally.  Otherwise, clear to auscultation without wheezing or rhonchi  ABDOMEN: Soft, non-tender, non-distended, No pulsatile mass, MUSCULOSKELETAL: No deformity  SKIN: Warm and dry NEUROLOGIC:  Alert and oriented x 3 PSYCHIATRIC:  Normal affect   ASSESSMENT:    1. Chronic systolic HF (heart failure) (HCC)   2. Paroxysmal atrial fibrillation ([place])   3. PAD (peripheral artery disease) ([place])   4. Essential hypertension   5. Coronary artery disease involving coronary bypass graft of native heart without angina pectoris   6. Chronic anticoagulation    PLAN:    In order of problems listed above:  Clinical story is compatible with heart failure.  BNP is consistent with volume overload.  Increase furosemide to 120 mg a.m. and 80 mg p.m. for 2 days then back to 120 mg/day.  Additionally, add  12.5 mg of spironolactone daily.  7 to 10-day follow-up with basic metabolic panel drawn the day  of the visit.  2D Doppler echocardiogram to reassess LV function. No change compared to EKGs over the past 2 years. Not discussed BP is a little high.  It will improve with increased diuresis and spironolactone. Chest gets a little tight when he is short of breath.  May need an ischemic evaluation. Continue Coumadin therapy.  Overall education and awareness concerning primary/secondary risk prevention was discussed in detail: LDL less than 70, hemoglobin A1c less than 7, blood pressure target less than 130/80 mmHg, >150 minutes of moderate aerobic activity per week, avoidance of smoking, weight control (via diet and exercise), and continued surveillance/management of/for obstructive sleep apnea.  Guideline directed therapy for left ventricular systolic dysfunction: Angiotensin receptor-neprilysin inhibitor (ARNI)-Entresto; beta-blocker therapy - carvedilol, metoprolol succinate, or bisoprolol; mineralocorticoid receptor antagonist (MRA) therapy -spironolactone or eplerenone.  SGLT-2 agents -  Dapagliflozin [place]) or Empagliflozin (Jardiance).These therapies have been shown to improve clinical outcomes including reduction of rehospitalization, survival, and acute heart failure.   Medication Adjustments/Labs and Tests Ordered: Current medicines are reviewed at length with the patient today.  Concerns regarding medicines are outlined above.  Orders Placed This Encounter  Procedures   EKG 12-Lead   ECHOCARDIOGRAM COMPLETE    Meds ordered this encounter  Medications   DISCONTD: spironolactone (ALDACTONE) 25 MG tablet    Sig: Take 0.5 tablets (12.5 mg total) by mouth daily.    Dispense:  45 tablet    Refill:  3   spironolactone (ALDACTONE) 25 MG tablet    Sig: Take 0.5 tablets (12.5 mg total) by mouth daily.    Dispense:  45 tablet    Refill:  3     Patient Instructions  Medication Instructions:  1) START Spironolactone 12.37m once daily 2) TAKE Furosemide 1249min the morning and  8020mn the evening today and tomorrow  *If you need a refill on your cardiac medications before your next appointment, please call your pharmacy*   Lab Work: None If you have labs (blood work) drawn today and your tests are completely normal, you will receive your results only by: [place] you have MyChart) OR A paper copy in the mail If you have any lab test that is abnormal or we need to change your treatment, we will call you to review the results.   Testing/Procedures: Your physician has requested that you have an echocardiogram before you see us [place] in 7-10 days if possible. Echocardiography is a painless test that uses sound waves to create images of your heart. It provides your doctor with information about the size and shape of your heart and how well your heart's chambers and valves are working. This procedure takes approximately one hour. There are no restrictions for this procedure.    Follow-Up: [facility] and your health needs are our priority.  As part of our continuing mission to provide you with exceptional heart care, we have created designated Provider Care Teams.  These Care Teams include your primary Cardiologist (physician) and Advanced Practice Providers (APPs -  Physician Assistants and Nurse Practitioners) who all work together to provide you with the care you need, when you need it.  We recommend signing up for the patient portal called "MyChart".  Sign up information is provided on this After Visit Summary.  MyChart is used to connect with patients for Virtual Visits (Telemedicine).  Patients are able to view lab/test  results, encounter notes, upcoming appointments, etc.  Non-urgent messages can be sent to your provider as well.   To learn more about what you can do with MyChart, go to [email or web address].    Your next appointment:   7-10 day(s)  The format for your next appointment:   In Person  Provider:   You may see Sinclair Grooms, MD or one of the following Advanced Practice Providers on your designated Care Team:   Cecilie Kicks, [place]   Other Instructions     Signed, Sinclair Grooms, MD  05/03/2021 12:49 PM    [place]

## 2021-05-03 ENCOUNTER — Ambulatory Visit: Payer: Medicare Other | Admitting: Interventional Cardiology

## 2021-05-03 ENCOUNTER — Encounter: Payer: Self-pay | Admitting: Interventional Cardiology

## 2021-05-03 VITALS — BP 148/72 | HR 57 | Ht 70.0 in | Wt 228.6 lb

## 2021-05-03 DIAGNOSIS — Z7901 Long term (current) use of anticoagulants: Secondary | ICD-10-CM

## 2021-05-03 DIAGNOSIS — I10 Essential (primary) hypertension: Secondary | ICD-10-CM

## 2021-05-03 DIAGNOSIS — I739 Peripheral vascular disease, unspecified: Secondary | ICD-10-CM

## 2021-05-03 DIAGNOSIS — I5022 Chronic systolic (congestive) heart failure: Secondary | ICD-10-CM

## 2021-05-03 DIAGNOSIS — I480 Paroxysmal atrial fibrillation: Secondary | ICD-10-CM | POA: Diagnosis not present

## 2021-05-03 DIAGNOSIS — I25810 Atherosclerosis of coronary artery bypass graft(s) without angina pectoris: Secondary | ICD-10-CM

## 2021-05-03 LAB — HEPATIC FUNCTION PANEL
ALT: 15 IU/L (ref 0–44)
AST: 13 IU/L (ref 0–40)
Albumin: 4.1 g/dL (ref 3.7–4.7)
Alkaline Phosphatase: 125 IU/L — ABNORMAL HIGH (ref 44–121)
Bilirubin Total: 0.7 mg/dL (ref 0.0–1.2)
Bilirubin, Direct: 0.21 mg/dL (ref 0.00–0.40)
Total Protein: 6.3 g/dL (ref 6.0–8.5)

## 2021-05-03 LAB — BASIC METABOLIC PANEL
BUN/Creatinine Ratio: 12 (ref 10–24)
BUN: 15 mg/dL (ref 8–27)
CO2: 22 mmol/L (ref 20–29)
Calcium: 8.6 mg/dL (ref 8.6–10.2)
Chloride: 100 mmol/L (ref 96–106)
Creatinine, Ser: 1.23 mg/dL (ref 0.76–1.27)
Glucose: 96 mg/dL (ref 65–99)
Potassium: 4.1 mmol/L (ref 3.5–5.2)
Sodium: 139 mmol/L (ref 134–144)
eGFR: 62 mL/min/{1.73_m2} (ref >59)

## 2021-05-03 LAB — CBC
Hematocrit: 34 % — ABNORMAL LOW (ref 37.5–51.0)
Hemoglobin: 11.2 g/dL — ABNORMAL LOW (ref 13.0–17.7)
MCH: 27.9 pg (ref 26.6–33.0)
MCHC: 32.9 g/dL (ref 31.5–35.7)
MCV: 85 fL (ref 79–97)
Platelets: 173 10*3/uL (ref 150–450)
RBC: 4.02 x10E6/uL — ABNORMAL LOW (ref 4.14–5.80)
RDW: 14.9 % (ref 11.6–15.4)
WBC: 4.6 10*3/uL (ref 3.4–10.8)

## 2021-05-03 LAB — PRO B NATRIURETIC PEPTIDE: NT-Pro BNP: 1851 pg/mL — ABNORMAL HIGH (ref 0–376)

## 2021-05-03 MED ORDER — SPIRONOLACTONE 25 MG PO TABS: 12.5000 mg | ORAL_TABLET | Freq: Every day | ORAL | 3 refills | Status: DC

## 2021-05-03 NOTE — Patient Instructions (Signed)
Medication Instructions:  1) START Spironolactone 12.'5mg'$  once daily 2) TAKE Furosemide '120mg'$  in the morning and '80mg'$  in the evening today and tomorrow  *If you need a refill on your cardiac medications before your next appointment, please call your pharmacy*   Lab Work: None If you have labs (blood work) drawn today and your tests are completely normal, you will receive your results only by: [place] (if you have MyChart) OR A paper copy in the mail If you have any lab test that is abnormal or we need to change your treatment, we will call you to review the results.   Testing/Procedures: Your physician has requested that you have an echocardiogram before you see [place] back in 7-10 days if possible. Echocardiography is a painless test that uses sound waves to create images of your heart. It provides your doctor with information about the size and shape of your heart and how well your heart's chambers and valves are working. This procedure takes approximately one hour. There are no restrictions for this procedure.    Follow-Up: At [facility], you and your health needs are our priority.  As part of our continuing mission to provide you with exceptional heart care, we have created designated Provider Care Teams.  These Care Teams include your primary Cardiologist (physician) and Advanced Practice Providers (APPs -  Physician Assistants and Nurse Practitioners) who all work together to provide you with the care you need, when you need it.  We recommend signing up for the patient portal called "MyChart".  Sign up information is provided on this After Visit Summary.  MyChart is used to connect with patients for Virtual Visits (Telemedicine).  Patients are able to view lab/test results, encounter notes, upcoming appointments, etc.  Non-urgent messages can be sent to your provider as well.   To learn more about what you can do with MyChart, go to [email or web address].    Your next  appointment:   7-10 day(s)  The format for your next appointment:   In Person  Provider:   You may see Sinclair Grooms, MD or one of the following Advanced Practice Providers on your designated Care Team:   Cecilie Kicks, [place]   Other Instructions

## 2021-05-09 ENCOUNTER — Other Ambulatory Visit: Payer: Medicare Other

## 2021-05-09 ENCOUNTER — Ambulatory Visit (HOSPITAL_BASED_OUTPATIENT_CLINIC_OR_DEPARTMENT_OTHER): Payer: Medicare Other | Attending: Cardiology

## 2021-05-09 ENCOUNTER — Other Ambulatory Visit: Payer: Self-pay

## 2021-05-09 DIAGNOSIS — I480 Paroxysmal atrial fibrillation: Secondary | ICD-10-CM | POA: Diagnosis not present

## 2021-05-09 DIAGNOSIS — I5022 Chronic systolic (congestive) heart failure: Secondary | ICD-10-CM | POA: Diagnosis not present

## 2021-05-09 LAB — ECHOCARDIOGRAM COMPLETE
Area-P 1/2: 2.7 cm2
MV M vel: 3.82 m/s
MV Peak grad: 58.4 mmHg
S' Lateral: 3.66 cm

## 2021-05-10 NOTE — Progress Notes (Signed)
Cardiology Office Note:    Date:  05/11/2021   ID:  [name], DOB [birth year], MRN [id number]  PCP:  Wenda Low, MD  Cardiologist:  Sinclair Grooms, MD   Referring MD: Wenda Low, MD   Chief Complaint  Patient presents with   Congestive Heart Failure   Atrial Fibrillation     History of Present Illness:    [name] is a 75 y.o. 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   He feels better after diuresis.  Weight is down 8 pounds compared to prior.  Lower extremity swelling has resolved.  Echo reveals mildly depressed LV systolic function.  No change from prior.  Wife states that he feels tired and sluggish most of the time.  This preceded the development of lower extremity edema and shortness of breath.  Noted today's heart rate of 39 at rest.  1 week ago the EKG demonstrated atrial fibrillation with slow ventricular response at 57 bpm.  Past Medical History:  Diagnosis Date   Abdominal pain    Afib (HCC)    Arthritis    in back   Bruises easily    Change in voice    Chest pain    CHF (congestive heart failure) (HCC)    Coronary artery disease    Cough    Diabetes mellitus Age 54   Diarrhea    Fungal infection of toenail 10-2014   left great toe   Generalized headaches    Heart attack ([place])    Hx of CABG 1993   Hyperlipidemia    Hypertension    Hyperthyroidism    Ischemic cardiomyopathy    Leg pain 03/17/2009   With walking   Leg swelling    Myocardial infarction [facility])    PAD (peripheral artery disease) ([place])    Right SFA stent   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 Dr Amedeo Plenty 2008   PVD (peripheral vascular disease) ([place])    Rash    Reflux    Retinopathy    Trouble swallowing    Tubular adenoma    Weakness    Weight loss, unintentional    Wheezing      Past Surgical History:  Procedure Laterality Date   ABDOMINAL AORTAGRAM N/A 11/11/2014   Procedure: ABDOMINAL Maxcine Ham;  Surgeon: Elam Dutch, MD;  Location: [facility] CATH LAB;  Service: Cardiovascular;  Laterality: N/A;   ABDOMINAL AORTOGRAM W/LOWER EXTREMITY Bilateral 07/23/2019   Procedure: ABDOMINAL AORTOGRAM W/LOWER EXTREMITY;  Surgeon: Elam Dutch, MD;  Location: [place] CV LAB;  Service: Cardiovascular;  Laterality: Bilateral;   BACK SURGERY  1994   L5 discectomy   CATARACT EXTRACTION, BILATERAL     CORONARY ARTERY BYPASS GRAFT  1993-2001   X2   PR VEIN BYPASS GRAFT,AORTO-FEM-POP  2008    Current Medications: Current Meds  Medication Sig   ACCU-CHEK AVIVA PLUS test strip 1 each daily.   acetaminophen (TYLENOL) 500 MG tablet Take 1,000 mg by mouth 2 (two) times daily as needed for pain (pain/headache).   atorvastatin (LIPITOR) 40 MG tablet Take 40 mg by mouth daily.   Blood Glucose Monitoring Suppl (ACCU-CHEK AVIVA PLUS) W/DEVICE KIT    carvedilol (COREG) 25 MG tablet Take 25 mg by mouth 2 (two) times daily with a meal.   clopidogrel (PLAVIX) 75 MG tablet Take 75  mg by mouth daily.   COVID-19 mRNA Vac-TriS, Pfizer, (PFIZER-BIONT COVID-19 VAC-TRIS) SUSP injection Inject into the muscle.   doxycycline (VIBRA-TABS) 100 MG tablet Take 1 tablet (100 mg total) by mouth 2 (two) times daily.   furosemide (LASIX) 80 MG tablet Take 120 mg by mouth daily.    metFORMIN (GLUCOPHAGE) 500 MG tablet Take 500 mg by mouth 2 (two) times daily with a meal.   metFORMIN (GLUCOPHAGE-XR) 500 MG 24 hr tablet Take 500 mg by mouth 2 (two) times daily.   metroNIDAZOLE (METROGEL) 1 % gel Apply 1 application topically daily as needed (rosacia).    NIFEdipine (PROCARDIA XL/ADALAT-CC) 90 MG 24 hr tablet Take 1 tablet by mouth once a day   nitroGLYCERIN (NITROSTAT) 0.4 MG SL tablet Place 0.4 mg under the tongue every 5 (five) minutes as needed for chest pain.   potassium chloride SA  (K-DUR,KLOR-CON) 20 MEQ tablet Take 20 mEq by mouth daily.   spironolactone (ALDACTONE) 25 MG tablet Take 0.5 tablets (12.5 mg total) by mouth daily.   telmisartan (MICARDIS) 80 MG tablet Take 80 mg by mouth daily.   warfarin (COUMADIN) 5 MG tablet Take 5 mg by mouth daily.      Allergies:   Amiodarone, Erythromycin, Methimazole, Other, and Penicillins   Social History   Socioeconomic History   Marital status: Married    Spouse name: Not on file   Number of children: Not on file   Years of education: Not on file   Highest education level: Not on file  Occupational History   Not on file  Tobacco Use   Smoking status: Former    Types: Cigarettes    Quit date: 09/03/1983    Years since quitting: 37.7   Smokeless tobacco: Never  Vaping Use   Vaping Use: Never used  Substance and Sexual Activity   Alcohol use: No   Drug use: No   Sexual activity: Not on file  Other Topics Concern   Not on file  Social History Narrative   Not on file   Social Determinants of Health   Financial Resource Strain: Not on file  Food Insecurity: Not on file  Transportation Needs: Not on file  Physical Activity: Not on file  Stress: Not on file  Social Connections: Not on file     Family History: The patient's family history includes Cancer in his brother, father, maternal aunt, and mother; Deep vein thrombosis in his brother; Diabetes in his daughter, father, and mother; Heart disease in his daughter, mother, and another family member; Hyperlipidemia in his brother, daughter, and mother; Hypertension in his brother, daughter, father, and mother; Peripheral vascular disease in his daughter.  ROS:   Please see the history of present illness.    Fatigue on exertion.  Dyspnea has resolved.  He has decreased appetite according to the wife all other systems reviewed and are negative.  EKGs/Labs/Other Studies Reviewed:    The following studies were reviewed today: No New data  EKG:  EKG today's  EKG demonstrates atrial failure with slow ventricular response averaging 41 bpm.  Recent Labs: 05/02/2021: ALT 15; BUN 15; Creatinine, Ser 1.23; Hemoglobin 11.2; NT-Pro BNP 1,851; Platelets 173; Potassium 4.1; Sodium 139  Recent Lipid Panel No results found for: CHOL, TRIG, HDL, CHOLHDL, VLDL, LDLCALC, LDLDIRECT  Physical Exam:    VS:  Ht $R'5\' 10"'cB$  (1.778 m)   Wt 220 lb 8 oz (100 kg)   BMI 31.64 kg/m     Wt Readings from Last  3 Encounters:  05/11/21 220 lb 8 oz (100 kg)  05/03/21 228 lb 9.6 oz (103.7 kg)  09/28/20 238 lb (108 kg)     GEN: Overweight. No acute distress HEENT: Normal NECK: No JVD. LYMPHATICS: No lymphadenopathy CARDIAC: No murmur.  Heart rate is slow with RRR no gallop, or edema. VASCULAR:  Normal Pulses. No bruits. RESPIRATORY:  Clear to auscultation without rales, wheezing or rhonchi  ABDOMEN: Soft, non-tender, non-distended, No pulsatile mass, MUSCULOSKELETAL: No deformity  SKIN: Warm and dry NEUROLOGIC:  Alert and oriented x 3 PSYCHIATRIC:  Normal affect   ASSESSMENT:    1. SOB (shortness of breath)   2. Chronic combined systolic and diastolic heart failure (HCC)   3. Paroxysmal atrial fibrillation ([place])   4. PAD (peripheral artery disease) ([place])   5. Essential hypertension   6. Coronary artery disease involving coronary bypass graft of native heart without angina pectoris    PLAN:    In order of problems listed above:  Improved with diuresis and addition of spironolactone. LV function is stable by echo.  Add Jardiance 10 mg/day.  Bmet today and in 1 week..  Decrease furosemide to 80 mg/day.  Continue spironolactone, telmisartan, and decrease intensity of carvedilol (see below). A. fib with slow ventricular response and heart rate of 41 bpm.  Hold p.m. dose of carvedilol today.  He is already had 25 mg this morning.  Change dose to 12.5 mg twice daily.  Continue to monitor heart rate and overall condition.  1 month follow-up. Not discussed Relatively  low.  Decreasing furosemide intensity and carvedilol will prevent this from progressing. They are worried that he has a blocked artery.  He is not having angina.  We will continue with medication adjustment and consider coronary angiography only if he does not improve.  Guideline directed therapy for left ventricular systolic dysfunction: Angiotensin receptor-neprilysin inhibitor (ARNI)-Entresto; beta-blocker therapy - carvedilol, metoprolol succinate, or bisoprolol; mineralocorticoid receptor antagonist (MRA) therapy -spironolactone or eplerenone.  SGLT-2 agents -  Dapagliflozin [place]) or Empagliflozin (Jardiance).These therapies have been shown to improve clinical outcomes including reduction of rehospitalization, survival, and acute heart failure.  Basic metabolic panel today and in 1 week.  Medication Adjustments/Labs and Tests Ordered: Current medicines are reviewed at length with the patient today.  Concerns regarding medicines are outlined above.  No orders of the defined types were placed in this encounter.  No orders of the defined types were placed in this encounter.   There are no Patient Instructions on file for this visit.   Signed, Sinclair Grooms, MD  05/11/2021 8:20 AM    [place]

## 2021-05-11 ENCOUNTER — Encounter: Payer: Self-pay | Admitting: Interventional Cardiology

## 2021-05-11 ENCOUNTER — Ambulatory Visit: Payer: Medicare Other | Admitting: Interventional Cardiology

## 2021-05-11 ENCOUNTER — Other Ambulatory Visit: Payer: Self-pay

## 2021-05-11 VITALS — BP 118/60 | HR 41 | Ht 70.0 in | Wt 220.5 lb

## 2021-05-11 DIAGNOSIS — R0602 Shortness of breath: Secondary | ICD-10-CM | POA: Diagnosis not present

## 2021-05-11 DIAGNOSIS — I480 Paroxysmal atrial fibrillation: Secondary | ICD-10-CM

## 2021-05-11 DIAGNOSIS — I5042 Chronic combined systolic (congestive) and diastolic (congestive) heart failure: Secondary | ICD-10-CM

## 2021-05-11 DIAGNOSIS — I739 Peripheral vascular disease, unspecified: Secondary | ICD-10-CM

## 2021-05-11 DIAGNOSIS — I10 Essential (primary) hypertension: Secondary | ICD-10-CM

## 2021-05-11 DIAGNOSIS — I25810 Atherosclerosis of coronary artery bypass graft(s) without angina pectoris: Secondary | ICD-10-CM

## 2021-05-11 LAB — BASIC METABOLIC PANEL
BUN/Creatinine Ratio: 14 (ref 10–24)
BUN: 19 mg/dL (ref 8–27)
CO2: 23 mmol/L (ref 20–29)
Calcium: 9.1 mg/dL (ref 8.6–10.2)
Chloride: 98 mmol/L (ref 96–106)
Creatinine, Ser: 1.33 mg/dL — ABNORMAL HIGH (ref 0.76–1.27)
Glucose: 107 mg/dL — ABNORMAL HIGH (ref 65–99)
Potassium: 4.2 mmol/L (ref 3.5–5.2)
Sodium: 138 mmol/L (ref 134–144)
eGFR: 57 mL/min/{1.73_m2} — ABNORMAL LOW (ref >59)

## 2021-05-11 MED ORDER — SPIRONOLACTONE 25 MG PO TABS: 12.5000 mg | ORAL_TABLET | Freq: Every day | ORAL | 3 refills | Status: DC

## 2021-05-11 MED ORDER — FUROSEMIDE 80 MG PO TABS: 80.0000 mg | ORAL_TABLET | Freq: Every day | ORAL | 3 refills | Status: DC

## 2021-05-11 MED ORDER — CARVEDILOL 12.5 MG PO TABS: 12.5000 mg | ORAL_TABLET | Freq: Two times a day (BID) | ORAL | 3 refills | Status: AC

## 2021-05-11 MED ORDER — EMPAGLIFLOZIN 10 MG PO TABS: 10.0000 mg | ORAL_TABLET | Freq: Every day | ORAL | 3 refills | Status: DC

## 2021-05-11 NOTE — Patient Instructions (Signed)
Medication Instructions:  1) DECREASE Carvedilol to 12.'5mg'$  twice daily.  DO NOT take your Carvedilol this evening. 2) START Jardiance '10mg'$  once daily. 3) DECREASE Furosemide to '80mg'$  once daily  *If you need a refill on your cardiac medications before your next appointment, please call your pharmacy*   Lab Work: BMET today  BMET in 1 week  If you have labs (blood work) drawn today and your tests are completely normal, you will receive your results only by: [place] (if you have MyChart) OR A paper copy in the mail If you have any lab test that is abnormal or we need to change your treatment, we will call you to review the results.   Testing/Procedures: None   Follow-Up: At [facility], you and your health needs are our priority.  As part of our continuing mission to provide you with exceptional heart care, we have created designated Provider Care Teams.  These Care Teams include your primary Cardiologist (physician) and Advanced Practice Providers (APPs -  Physician Assistants and Nurse Practitioners) who all work together to provide you with the care you need, when you need it.  We recommend signing up for the patient portal called "MyChart".  Sign up information is provided on this After Visit Summary.  MyChart is used to connect with patients for Virtual Visits (Telemedicine).  Patients are able to view lab/test results, encounter notes, upcoming appointments, etc.  Non-urgent messages can be sent to your provider as well.   To learn more about what you can do with MyChart, go to [email or web address].    Your next appointment:   1 month(s)  The format for your next appointment:   In Person  Provider:   You may see Sinclair Grooms, MD or one of the following Advanced Practice Providers on your designated Care Team:   Cecilie Kicks, NP    Other Instructions  Make sure you are weighing daily.  If you notice your weight continuing to drop dramatically, please  let me know.

## 2021-05-18 ENCOUNTER — Other Ambulatory Visit: Payer: Medicare Other | Admitting: *Deleted

## 2021-05-18 ENCOUNTER — Other Ambulatory Visit: Payer: Self-pay

## 2021-05-18 DIAGNOSIS — I5042 Chronic combined systolic (congestive) and diastolic (congestive) heart failure: Secondary | ICD-10-CM

## 2021-05-18 LAB — BASIC METABOLIC PANEL
BUN/Creatinine Ratio: 24 (ref 10–24)
BUN: 32 mg/dL — ABNORMAL HIGH (ref 8–27)
CO2: 21 mmol/L (ref 20–29)
Calcium: 9.4 mg/dL (ref 8.6–10.2)
Chloride: 103 mmol/L (ref 96–106)
Creatinine, Ser: 1.32 mg/dL — ABNORMAL HIGH (ref 0.76–1.27)
Glucose: 95 mg/dL (ref 65–99)
Potassium: 4.4 mmol/L (ref 3.5–5.2)
Sodium: 141 mmol/L (ref 134–144)
eGFR: 57 mL/min/{1.73_m2} — ABNORMAL LOW (ref >59)

## 2021-05-22 ENCOUNTER — Other Ambulatory Visit (HOSPITAL_BASED_OUTPATIENT_CLINIC_OR_DEPARTMENT_OTHER): Payer: Self-pay

## 2021-05-22 ENCOUNTER — Ambulatory Visit: Payer: Medicare Other | Attending: Internal Medicine

## 2021-05-22 ENCOUNTER — Other Ambulatory Visit: Payer: Self-pay

## 2021-05-22 DIAGNOSIS — Z23 Encounter for immunization: Secondary | ICD-10-CM

## 2021-05-22 MED ORDER — INFLUENZA VAC A&B SA ADJ QUAD 0.5 ML IM PRSY
PREFILLED_SYRINGE | INTRAMUSCULAR | 0 refills | Status: AC
  Filled 2021-05-22: qty 0.5, 1d supply, fill #0

## 2021-05-22 MED ORDER — PFIZER COVID-19 VAC BIVALENT 30 MCG/0.3ML IM SUSP
INTRAMUSCULAR | 0 refills | Status: AC
  Filled 2021-05-22: qty 0.3, 1d supply, fill #0

## 2021-05-22 NOTE — Progress Notes (Signed)
   Covid-19 Vaccination Clinic  Name:  ALVEY BROCKEL    MRN: [id number] DOB: 1946-07-22  05/22/2021  Mr. Po was observed post Covid-19 immunization for 15 minutes without incident. He was provided with Vaccine Information Sheet and instruction to access the V-Safe system.   Mr. Arcidiacono was instructed to call 911 with any severe reactions post vaccine: Difficulty breathing  Swelling of face and throat  A fast heartbeat  A bad rash all over body  Dizziness and weakness

## 2021-05-30 ENCOUNTER — Other Ambulatory Visit: Payer: Self-pay

## 2021-05-30 MED ORDER — EMPAGLIFLOZIN 10 MG PO TABS: 10.0000 mg | ORAL_TABLET | Freq: Every day | ORAL | 3 refills | Status: DC

## 2021-06-04 ENCOUNTER — Other Ambulatory Visit: Payer: Self-pay

## 2021-06-04 MED ORDER — EMPAGLIFLOZIN 10 MG PO TABS: 10.0000 mg | ORAL_TABLET | Freq: Every day | ORAL | 3 refills | Status: AC

## 2021-06-09 NOTE — Progress Notes (Signed)
Cardiology Office Note:    Date:  06/09/2021   ID:  [name], DOB 1945/11/18, MRN [id number]  PCP:  Wenda Low, MD  Cardiologist:  Sinclair Grooms, MD   Referring MD: Wenda Low, MD   No chief complaint on file.   History of Present Illness:    [name] is a 75 y.o. 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    He feels better.  Breathing is improved.  No medication side effects.  Spironolactone and SGLT2 were added to his medication regimen.  The most recent creatinine was 1.34 done at [facility] last week.  Potassium was 4.6.  Prior to that the creatinine was 1.33 and potassium 4.4 in September.  Past Medical History:  Diagnosis Date   Abdominal pain    Afib ([place])    Arthritis    in back   Bruises easily    Change in voice    Chest pain    CHF (congestive heart failure) (HCC)    Coronary artery disease    Cough    Diabetes mellitus Age 68   Diarrhea    Fungal infection of toenail 10-2014   left great toe   Generalized headaches    Heart attack ([place])    Hx of CABG 1993   Hyperlipidemia    Hypertension    Hyperthyroidism    Ischemic cardiomyopathy    Leg pain 03/17/2009   With walking   Leg swelling    Myocardial infarction [facility])    PAD (peripheral artery disease) (HCC)    Right SFA stent   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 CATH LAB;  Service: Cardiovascular;  Laterality: N/A;   ABDOMINAL AORTOGRAM  W/LOWER EXTREMITY Bilateral 07/23/2019   Procedure: ABDOMINAL AORTOGRAM W/LOWER EXTREMITY;  Surgeon: Elam Dutch, MD;  Location: [facility] CV LAB;  Service: Cardiovascular;  Laterality: Bilateral;   BACK SURGERY  1994   L5 discectomy   CATARACT EXTRACTION, BILATERAL     CORONARY ARTERY BYPASS GRAFT  1993-2001   X2   PR VEIN BYPASS GRAFT,AORTO-FEM-POP  2008    Current Medications: No outpatient medications have been marked as taking for the 06/11/21 encounter (Appointment) with Belva Crome, MD.     Allergies:   Amiodarone, Erythromycin, Methimazole, Other, and Penicillins   Social History   Socioeconomic History   Marital status: Married    Spouse name: Not on file   Number of children: Not on file   Years of education: Not on file   Highest education level: Not on file  Occupational History   Not on file  Tobacco Use   Smoking status: Former    Types: Cigarettes    Quit date: 09/03/1983    Years since quitting: 37.7   Smokeless tobacco: Never  Vaping Use   Vaping Use: Never used  Substance and Sexual Activity   Alcohol use:  No   Drug use: No   Sexual activity: Not on file  Other Topics Concern   Not on file  Social History Narrative   Not on file   Social Determinants of Health   Financial Resource Strain: Not on file  Food Insecurity: Not on file  Transportation Needs: Not on file  Physical Activity: Not on file  Stress: Not on file  Social Connections: Not on file     Family History: The patient's family history includes Cancer in his brother, father, maternal aunt, and mother; Deep vein thrombosis in his brother; Diabetes in his daughter, father, and mother; Heart disease in his daughter, mother, and another family member; Hyperlipidemia in his brother, daughter, and mother; Hypertension in his brother, daughter, father, and mother; Peripheral vascular disease in his daughter.  ROS:   Please see the history of present illness.    Appetite is  stable all other systems reviewed and are negative.  EKGs/Labs/Other Studies Reviewed:    The following studies were reviewed today: No new laboratory data  EKG:  EKG not repeated  Recent Labs: 05/02/2021: ALT 15; Hemoglobin 11.2; NT-Pro BNP 1,851; Platelets 173 05/18/2021: BUN 32; Creatinine, Ser 1.32; Potassium 4.4; Sodium 141  Recent Lipid Panel No results found for: CHOL, TRIG, HDL, CHOLHDL, VLDL, LDLCALC, LDLDIRECT  Physical Exam:    VS:  There were no vitals taken for this visit.    Wt Readings from Last 3 Encounters:  05/11/21 220 lb 8 oz (100 kg)  05/03/21 228 lb 9.6 oz (103.7 kg)  09/28/20 238 lb (108 kg)     GEN: Overweight. No acute distress HEENT: Normal NECK: No JVD. LYMPHATICS: No lymphadenopathy CARDIAC: No murmur. RRR no gallop, or edema. VASCULAR:  Normal Pulses. No bruits. RESPIRATORY:  Clear to auscultation without rales, wheezing or rhonchi  ABDOMEN: Soft, non-tender, non-distended, No pulsatile mass, MUSCULOSKELETAL: No deformity  SKIN: Warm and dry NEUROLOGIC:  Alert and oriented x 3 PSYCHIATRIC:  Normal affect   ASSESSMENT:    1. Chronic combined systolic and diastolic heart failure (HCC)   2. Paroxysmal atrial fibrillation ([place])   3. PAD (peripheral artery disease) ([place])   4. Essential hypertension   5. Coronary artery disease involving coronary bypass graft of native heart without angina pectoris   6. Chronic anticoagulation    PLAN:    In order of problems listed above:  No evidence of volume overload on exam.  Current therapy is Jardiance 10 mg daily, carvedilol 12.5 mg twice daily, Micardis 80 mg/day, and Aldactone 25 mg 1/2 tablet by mouth daily. Continue current therapy.  Decreasing the carvedilol from 25 mg twice daily to 12.5 mg twice daily has significantly improved the heart rate in the patient feels stronger.  Continue Coumadin therapy. Stable without claudication.  Continue Plavix. Blood pressure control is excellent No angina.   Continue preventive therapy. Coumadin therapy without bleeding.  Overall education and awareness concerning primary/secondary risk prevention was discussed in detail: LDL less than 70, hemoglobin A1c less than 7, blood pressure target less than 130/80 mmHg, >150 minutes of moderate aerobic activity per week, avoidance of smoking, weight control (via diet and exercise), and continued surveillance/management of/for obstructive sleep apnea.    Medication Adjustments/Labs and Tests Ordered: Current medicines are reviewed at length with the patient today.  Concerns regarding medicines are outlined above.  No orders of the defined types were placed in this encounter.  No orders of the defined types were placed in this encounter.   There  are no Patient Instructions on file for this visit.   Signed, Sinclair Grooms, MD  06/09/2021 2:31 PM    [place]

## 2021-06-11 ENCOUNTER — Encounter: Payer: Self-pay | Admitting: Interventional Cardiology

## 2021-06-11 ENCOUNTER — Ambulatory Visit: Payer: Medicare Other | Admitting: Interventional Cardiology

## 2021-06-11 ENCOUNTER — Other Ambulatory Visit: Payer: Self-pay

## 2021-06-11 VITALS — BP 110/60 | HR 59 | Ht 70.0 in | Wt 218.8 lb

## 2021-06-11 DIAGNOSIS — I739 Peripheral vascular disease, unspecified: Secondary | ICD-10-CM

## 2021-06-11 DIAGNOSIS — I480 Paroxysmal atrial fibrillation: Secondary | ICD-10-CM | POA: Diagnosis not present

## 2021-06-11 DIAGNOSIS — I25810 Atherosclerosis of coronary artery bypass graft(s) without angina pectoris: Secondary | ICD-10-CM

## 2021-06-11 DIAGNOSIS — I10 Essential (primary) hypertension: Secondary | ICD-10-CM | POA: Diagnosis not present

## 2021-06-11 DIAGNOSIS — I5042 Chronic combined systolic (congestive) and diastolic (congestive) heart failure: Secondary | ICD-10-CM | POA: Diagnosis not present

## 2021-06-11 DIAGNOSIS — Z7901 Long term (current) use of anticoagulants: Secondary | ICD-10-CM

## 2021-06-11 NOTE — Patient Instructions (Addendum)
Medication Instructions:  Your physician recommends that you continue on your current medications as directed. Please refer to the Current Medication list given to you today.  *If you need a refill on your cardiac medications before your next appointment, please call your pharmacy*   Lab Work: None If you have labs (blood work) drawn today and your tests are completely normal, you will receive your results only by: [place] (if you have MyChart) OR A paper copy in the mail If you have any lab test that is abnormal or we need to change your treatment, we will call you to review the results.   Testing/Procedures: None   Follow-Up: At [facility], you and your health needs are our priority.  As part of our continuing mission to provide you with exceptional heart care, we have created designated Provider Care Teams.  These Care Teams include your primary Cardiologist (physician) and Advanced Practice Providers (APPs -  Physician Assistants and Nurse Practitioners) who all work together to provide you with the care you need, when you need it.  We recommend signing up for the patient portal called "MyChart".  Sign up information is provided on this After Visit Summary.  MyChart is used to connect with patients for Virtual Visits (Telemedicine).  Patients are able to view lab/test results, encounter notes, upcoming appointments, etc.  Non-urgent messages can be sent to your provider as well.   To learn more about what you can do with MyChart, go to [email or web address].    Your next appointment:   3-4 month(s)  The format for your next appointment:   In Person  Provider:   You may see Sinclair Grooms, MD or one of the following Advanced Practice Providers on your designated Care Team:   Cecilie Kicks, NP   Other Instructions

## 2021-09-14 ENCOUNTER — Ambulatory Visit: Payer: Medicare Other | Admitting: Interventional Cardiology

## 2021-09-14 ENCOUNTER — Other Ambulatory Visit: Payer: Self-pay

## 2021-09-14 ENCOUNTER — Encounter: Payer: Self-pay | Admitting: Interventional Cardiology

## 2021-09-14 VITALS — BP 128/52 | HR 58 | Ht 70.0 in | Wt 229.2 lb

## 2021-09-14 DIAGNOSIS — I10 Essential (primary) hypertension: Secondary | ICD-10-CM

## 2021-09-14 DIAGNOSIS — I480 Paroxysmal atrial fibrillation: Secondary | ICD-10-CM | POA: Diagnosis not present

## 2021-09-14 DIAGNOSIS — Z7901 Long term (current) use of anticoagulants: Secondary | ICD-10-CM

## 2021-09-14 DIAGNOSIS — I739 Peripheral vascular disease, unspecified: Secondary | ICD-10-CM

## 2021-09-14 DIAGNOSIS — I25810 Atherosclerosis of coronary artery bypass graft(s) without angina pectoris: Secondary | ICD-10-CM

## 2021-09-14 DIAGNOSIS — I5042 Chronic combined systolic (congestive) and diastolic (congestive) heart failure: Secondary | ICD-10-CM

## 2021-09-14 NOTE — Progress Notes (Signed)
Cardiology Office Note:    Date:  09/14/2021   ID:  [name], DOB 08-07-1946, MRN [id number]  PCP:  Wenda Low, MD  Cardiologist:  Sinclair Grooms, MD   Referring MD: Wenda Low, MD   Chief Complaint  Patient presents with   Congestive Heart Failure   Coronary Artery Disease    History of Present Illness:    [name] is a 76 y.o. 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    [name] is doing well.  He denies orthopnea, PND, and states his breathing is back to normal.  He has not had syncope or excessive bradycardia.  Past Medical History:  Diagnosis Date   Abdominal pain    [place]    Arthritis    in back   Bruises easily    Change in voice    Chest pain    CHF (congestive heart failure) (HCC)    Coronary artery disease    Cough    Diabetes mellitus Age 76   Diarrhea    Fungal infection of toenail 10-2014   left great toe   Generalized headaches    Heart attack ([place])    Hx of CABG 1993   Hyperlipidemia    Hypertension    Hyperthyroidism    Ischemic cardiomyopathy    Leg pain 03/17/2009   With walking   Leg swelling    Myocardial infarction [facility])    PAD (peripheral artery disease) (HCC)    Right SFA stent   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[place])    repaired left side Dr Amedeo Plenty 2008   PVD (peripheral vascular disease) ([place])    Rash    Reflux    Retinopathy    Trouble swallowing    Tubular adenoma    Weakness    Weight loss, unintentional    Wheezing     Past Surgical History:  Procedure Laterality Date   ABDOMINAL AORTAGRAM N/A 11/11/2014   Procedure: ABDOMINAL Maxcine Ham;  Surgeon: Elam Dutch, MD;  Location: [facility] CATH LAB;  Service: Cardiovascular;  Laterality: N/A;   ABDOMINAL AORTOGRAM W/LOWER EXTREMITY Bilateral 07/23/2019   Procedure: ABDOMINAL AORTOGRAM  W/LOWER EXTREMITY;  Surgeon: Elam Dutch, MD;  Location: [place] CV LAB;  Service: Cardiovascular;  Laterality: Bilateral;   BACK SURGERY  1994   L5 discectomy   CATARACT EXTRACTION, BILATERAL     CORONARY ARTERY BYPASS GRAFT  1993-2001   X2   PR VEIN BYPASS GRAFT,AORTO-FEM-POP  2008    Current Medications: Current Meds  Medication Sig   ACCU-CHEK AVIVA PLUS test strip 1 each daily.   acetaminophen (TYLENOL) 500 MG tablet Take 1,000 mg by mouth 2 (two) times daily as needed for pain (pain/headache).   atorvastatin (LIPITOR) 40 MG tablet Take 40 mg by mouth daily.   Blood Glucose Monitoring Suppl (ACCU-CHEK AVIVA PLUS) W/DEVICE KIT    carvedilol (COREG) 12.5 MG tablet Take 1 tablet (12.5 mg total) by mouth 2 (two) times daily.   clopidogrel (PLAVIX) 75 MG tablet Take 75 mg by mouth daily.   COVID-19 mRNA bivalent vaccine, Pfizer, (PFIZER COVID-19 VAC BIVALENT) injection Inject into the muscle.   COVID-19 mRNA Vac-TriS, Pfizer, (PFIZER-BIONT COVID-19 VAC-TRIS) SUSP injection Inject into the muscle.   empagliflozin (JARDIANCE) 10 MG TABS tablet Take 1 tablet (10 mg total) by mouth daily before breakfast.   furosemide (  LASIX) 80 MG tablet Take 1 tablet (80 mg total) by mouth daily.   influenza vaccine adjuvanted (FLUAD) 0.5 ML injection Inject into the muscle.   metFORMIN (GLUCOPHAGE-XR) 500 MG 24 hr tablet Take 500 mg by mouth 2 (two) times daily.   metroNIDAZOLE (METROGEL) 1 % gel Apply 1 application topically daily as needed (rosacia).    NIFEdipine (PROCARDIA XL/ADALAT-CC) 90 MG 24 hr tablet Take 1 tablet by mouth once a day   nitroGLYCERIN (NITROSTAT) 0.4 MG SL tablet Place 0.4 mg under the tongue every 5 (five) minutes as needed for chest pain.   potassium chloride SA (K-DUR,KLOR-CON) 20 MEQ tablet Take 20 mEq by mouth daily.   spironolactone (ALDACTONE) 25 MG tablet Take 0.5 tablets (12.5 mg total) by mouth daily.   telmisartan (MICARDIS) 80 MG tablet Take 80 mg by mouth  daily.   warfarin (COUMADIN) 5 MG tablet Take 5 mg by mouth daily.    [DISCONTINUED] doxycycline (VIBRA-TABS) 100 MG tablet Take 1 tablet (100 mg total) by mouth 2 (two) times daily.     Allergies:   Amiodarone, Erythromycin, Methimazole, Other, and Penicillins   Social History   Socioeconomic History   Marital status: Married    Spouse name: Not on file   Number of children: Not on file   Years of education: Not on file   Highest education level: Not on file  Occupational History   Not on file  Tobacco Use   Smoking status: Former    Types: Cigarettes    Quit date: 09/03/1983    Years since quitting: 38.0   Smokeless tobacco: Never  Vaping Use   Vaping Use: Never used  Substance and Sexual Activity   Alcohol use: No   Drug use: No   Sexual activity: Not on file  Other Topics Concern   Not on file  Social History Narrative   Not on file   Social Determinants of Health   Financial Resource Strain: Not on file  Food Insecurity: Not on file  Transportation Needs: Not on file  Physical Activity: Not on file  Stress: Not on file  Social Connections: Not on file     Family History: The patient's family history includes Cancer in his brother, father, maternal aunt, and mother; Deep vein thrombosis in his brother; Diabetes in his daughter, father, and mother; Heart disease in his daughter, mother, and another family member; Hyperlipidemia in his brother, daughter, and mother; Hypertension in his brother, daughter, father, and mother; Peripheral vascular disease in his daughter.  ROS:   Please see the history of present illness.    Compliant with medication regimen.  All other systems reviewed and are negative.  EKGs/Labs/Other Studies Reviewed:    The following studies were reviewed today: 2D Doppler echocardiogram/2022 IMPRESSIONS     1. Left ventricular ejection fraction, by estimation, is 40 to 45%. The  left ventricle has mildly decreased function. The left  ventricle  demonstrates regional wall motion abnormalities (see scoring  diagram/findings for description). There is mild  concentric left ventricular hypertrophy. Left ventricular diastolic  function could not be evaluated. There is severe hypokinesis of the left  ventricular, apical septal wall and inferior wall.   2. Right ventricular systolic function is moderately reduced. The right  ventricular size is moderately enlarged. There is moderately elevated  pulmonary artery systolic pressure. The estimated right ventricular  systolic pressure is 78.9 mmHg.   3. Left atrial size was severely dilated.   4. Right atrial size was  moderately dilated.   5. The mitral valve is degenerative. Mild mitral valve regurgitation.  Moderate to severe mitral annular calcification.   6. Tricuspid valve regurgitation is moderate.   7. The aortic valve is tricuspid. Aortic valve regurgitation is not  visualized. Mild to moderate aortic valve sclerosis/calcification is  present, without any evidence of aortic stenosis.   8. There is borderline dilatation of the ascending aorta, measuring 39  mm.   9. The inferior vena cava is dilated in size with >50% respiratory  variability, suggesting right atrial pressure of 8 mmHg.   Comparison(s): No significant change from prior study. Prior images  reviewed side by side. The left ventricular function is unchanged. The  left ventricular wall motion abnormality is unchanged.  RV systolic pressure is higher.   EKG:  EKG not repeated  Recent Labs: 05/02/2021: ALT 15; Hemoglobin 11.2; NT-Pro BNP 1,851; Platelets 173 05/18/2021: BUN 32; Creatinine, Ser 1.32; Potassium 4.4; Sodium 141  Recent Lipid Panel No results found for: CHOL, TRIG, HDL, CHOLHDL, VLDL, LDLCALC, LDLDIRECT  Physical Exam:    VS:  BP (!) 128/52    Pulse (!) 58    Ht $R'5\' 10"'Ba$  (1.778 m)    Wt 229 lb 4 oz (104 kg)    SpO2 97%    BMI 32.89 kg/m     Wt Readings from Last 3 Encounters:  09/14/21 229  lb 4 oz (104 kg)  06/11/21 218 lb 12.8 oz (99.2 kg)  05/11/21 220 lb 8 oz (100 kg)     GEN: Obese. No acute distress HEENT: Normal NECK: No JVD. LYMPHATICS: No lymphadenopathy CARDIAC: No murmur. RRR S4 gallop, or edema. VASCULAR:  Normal Pulses. No bruits. RESPIRATORY:  Clear to auscultation without rales, wheezing or rhonchi  ABDOMEN: Soft, non-tender, non-distended, No pulsatile mass, MUSCULOSKELETAL: No deformity  SKIN: Warm and dry NEUROLOGIC:  Alert and oriented x 3 PSYCHIATRIC:  Normal affect   ASSESSMENT:    1. Chronic combined systolic and diastolic heart failure (HCC)   2. Paroxysmal atrial fibrillation ([place])   3. PAD (peripheral artery disease) ([place])   4. Coronary artery disease involving coronary bypass graft of native heart without angina pectoris   5. Essential hypertension   6. Chronic anticoagulation    PLAN:    In order of problems listed above:  Guideline directed heart failure therapy.  Continue Aldactone, Micardis, Jardiance, and carvedilol. Currently in sinus rhythm based upon clinical exam No claudication Secondary prevention reviewed Excellent blood pressure control Continue Plavix and Coumadin/ Plavix.  Overall education and awareness concerning secondary risk prevention was discussed in detail: LDL less than 70, hemoglobin A1c less than 7, blood pressure target less than 130/80 mmHg, >150 minutes of moderate aerobic activity per week, avoidance of smoking, weight control (via diet and exercise), and continued surveillance/management of/for obstructive sleep apnea.    Medication Adjustments/Labs and Tests Ordered: Current medicines are reviewed at length with the patient today.  Concerns regarding medicines are outlined above.  No orders of the defined types were placed in this encounter.  No orders of the defined types were placed in this encounter.   There are no Patient Instructions on file for this visit.   Signed, Sinclair Grooms, MD   09/14/2021 2:41 PM    [place]

## 2021-09-14 NOTE — Patient Instructions (Signed)
Medication Instructions:  ?Your physician recommends that you continue on your current medications as directed. Please refer to the Current Medication list given to you today. ? ?*If you need a refill on your cardiac medications before your next appointment, please call your pharmacy* ? ? ?Lab Work: ?None ?If you have labs (blood work) drawn today and your tests are completely normal, you will receive your results only by: ?MyChart Message (if you have MyChart) OR ?A paper copy in the mail ?If you have any lab test that is abnormal or we need to change your treatment, we will call you to review the results. ? ? ?Testing/Procedures: ?None ? ? ?Follow-Up: ?At [facility], you and your health needs are our priority.  As part of our continuing mission to provide you with exceptional heart care, we have created designated Provider Care Teams.  These Care Teams include your primary Cardiologist (physician) and Advanced Practice Providers (APPs -  Physician Assistants and Nurse Practitioners) who all work together to provide you with the care you need, when you need it. ? ?We recommend signing up for the patient portal called "MyChart".  Sign up information is provided on this After Visit Summary.  MyChart is used to connect with patients for Virtual Visits (Telemedicine).  Patients are able to view lab/test results, encounter notes, upcoming appointments, etc.  Non-urgent messages can be sent to your provider as well.   ?To learn more about what you can do with MyChart, go to [email or web address].   ? ?Your next appointment:   ?6 month(s) ? ?The format for your next appointment:   ?In Person ? ?Provider:   ?Henry W Smith III, MD  ? ? ?Other Instructions ?  ?

## 2022-02-22 ENCOUNTER — Other Ambulatory Visit: Payer: Self-pay | Admitting: *Deleted

## 2022-02-22 DIAGNOSIS — I70219 Atherosclerosis of native arteries of extremities with intermittent claudication, unspecified extremity: Secondary | ICD-10-CM

## 2022-03-15 ENCOUNTER — Ambulatory Visit: Payer: Medicare Other | Admitting: Vascular Surgery

## 2022-03-15 ENCOUNTER — Encounter (HOSPITAL_COMMUNITY): Payer: Medicare Other

## 2022-06-04 ENCOUNTER — Telehealth: Payer: Self-pay | Admitting: Interventional Cardiology

## 2022-06-04 NOTE — Telephone Encounter (Signed)
I spoke with patient.  He is asking for generic alternative to Jardiance.Patient is in the donut hole.  I explained there is no generic available and discussed assistance program with him. Patient not interested in applying for assistance program at this time and is asking if another medication could be prescribed.  Patient is also due for follow up with Dr Tamala Julian.  Patient would prefer to see Dr Tamala Julian not an APP.  Will forward to Dr Tamala Julian regarding possible medication change and to [facility] regarding appointment

## 2022-06-04 NOTE — Telephone Encounter (Signed)
Pt c/o medication issue:  1. Name of Medication: empagliflozin (JARDIANCE) 10 MG TABS tablet  2. How are you currently taking this medication (dosage and times per day)? 1 tablet daily  3. Are you having a reaction (difficulty breathing--STAT)? no  4. What is your medication issue? Patient states he has some concerns about the medication. He says he is not having a reaction to the medication.

## 2022-06-05 NOTE — Telephone Encounter (Signed)
Returned call to patient and scheduled appt for him to see Dr. Tamala Julian on 06/14/22.  Informed patient that message has been sent to Dr. Tamala Julian regarding Vania Rea but no response yet. Patient states he will discuss at appt next week.  Patient expressed appreciation for follow-up.

## 2022-06-12 NOTE — Progress Notes (Signed)
Cardiology Office Note:    Date:  06/14/2022   ID:  [name], DOB 05-Oct-1945, MRN [id number]  PCP:  Wenda Low, MD  Cardiologist:  Sinclair Grooms, MD   Referring MD: Wenda Low, MD   Chief Complaint  Patient presents with   Congestive Heart Failure   Coronary Artery Disease   Hyperlipidemia   Follow-up    Diabetes mellitus    History of Present Illness:    [name] is a 76 y.o. male with a hx of  chronic AF, chronic Coumadin therapy, hypertension, hyperlipidemia, PAD with claudication, chronic combined systolic and diastolic heart failure, CAD with prior CABG, and allergic to amiodarone.    He feels well.  He is concerned because the creatinine obtained by Dr. Deforest Hoyles was slightly elevated above baseline.  No lower extremity swelling, orthopnea and PND has resolved.  Exertional tolerance is good.  He denies chest pain.  Past Medical History:  Diagnosis Date   Abdominal pain    Afib ([place])    Arthritis    in back   Bruises easily    Change in voice    Chest pain    CHF (congestive heart failure) (HCC)    Coronary artery disease    Cough    Diabetes mellitus Age 44   Diarrhea    Fungal infection of toenail 10-2014   left great toe   Generalized headaches    Heart attack ([place])    Hx of CABG 1993   Hyperlipidemia    Hypertension    Hyperthyroidism    Ischemic cardiomyopathy    Leg pain 03/17/2009   With walking   Leg swelling    Myocardial infarction [facility])    PAD (peripheral artery disease) (HCC)    Right SFA stent   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[place])    repaired left side Dr Amedeo Plenty 2008   PVD (peripheral vascular disease) ([place])    Rash    Reflux    Retinopathy    Trouble swallowing    Tubular adenoma    Weakness    Weight loss, unintentional    Wheezing     Past Surgical History:  Procedure Laterality Date   ABDOMINAL AORTAGRAM N/A 11/11/2014   Procedure: ABDOMINAL Maxcine Ham;  Surgeon: Elam Dutch, MD;  Location: [facility] CATH LAB;   Service: Cardiovascular;  Laterality: N/A;   ABDOMINAL AORTOGRAM W/LOWER EXTREMITY Bilateral 07/23/2019   Procedure: ABDOMINAL AORTOGRAM W/LOWER EXTREMITY;  Surgeon: Elam Dutch, MD;  Location: [place] CV LAB;  Service: Cardiovascular;  Laterality: Bilateral;   BACK SURGERY  1994   L5 discectomy   CATARACT EXTRACTION, BILATERAL     CORONARY ARTERY BYPASS GRAFT  1993-2001   X2   PR VEIN BYPASS GRAFT,AORTO-FEM-POP  2008    Current Medications: Current Meds  Medication Sig   ACCU-CHEK AVIVA PLUS test strip 1 each daily.   acetaminophen (TYLENOL) 500 MG tablet Take 1,000 mg by mouth 2 (two) times daily as needed for pain (pain/headache).   atorvastatin (LIPITOR) 40 MG tablet Take 40 mg by mouth daily.   Blood Glucose Monitoring Suppl (ACCU-CHEK AVIVA PLUS) W/DEVICE KIT    carvedilol (COREG) 12.5 MG tablet Take 1 tablet (12.5 mg total) by mouth 2 (two) times daily.   clopidogrel (PLAVIX) 75 MG tablet Take 75 mg by mouth daily.   COVID-19 mRNA bivalent vaccine, Pfizer, (PFIZER COVID-19 VAC BIVALENT) injection Inject into the muscle.   COVID-19 mRNA Vac-TriS, Pfizer, (PFIZER-BIONT COVID-19 VAC-TRIS) SUSP injection Inject  into the muscle.   empagliflozin (JARDIANCE) 10 MG TABS tablet Take 1 tablet (10 mg total) by mouth daily before breakfast.   influenza vaccine adjuvanted (FLUAD) 0.5 ML injection Inject into the muscle.   metFORMIN (GLUCOPHAGE-XR) 500 MG 24 hr tablet Take 500 mg by mouth 2 (two) times daily.   minocycline (DYNACIN) 50 MG tablet Take by mouth.   NIFEdipine (PROCARDIA XL/ADALAT-CC) 90 MG 24 hr tablet Take 1 tablet by mouth once a day   nitroGLYCERIN (NITROSTAT) 0.4 MG SL tablet Place 0.4 mg under the tongue every 5 (five) minutes as needed for chest pain.   potassium chloride SA (K-DUR,KLOR-CON) 20 MEQ tablet Take 20 mEq by mouth daily.   spironolactone (ALDACTONE) 25 MG tablet TAKE 1/2 TABLET BY MOUTH EVERY DAY   telmisartan (MICARDIS) 80 MG tablet Take 80 mg by  mouth daily.   warfarin (COUMADIN) 5 MG tablet Take 5 mg by mouth daily.    [DISCONTINUED] furosemide (LASIX) 80 MG tablet Take 1 tablet (80 mg total) by mouth daily.     Allergies:   Amiodarone, Erythromycin, Methimazole, Other, and Penicillins   Social History   Socioeconomic History   Marital status: Married    Spouse name: Not on file   Number of children: Not on file   Years of education: Not on file   Highest education level: Not on file  Occupational History   Not on file  Tobacco Use   Smoking status: Former    Types: Cigarettes    Quit date: 09/03/1983    Years since quitting: 38.8   Smokeless tobacco: Never  Vaping Use   Vaping Use: Never used  Substance and Sexual Activity   Alcohol use: No   Drug use: No   Sexual activity: Not on file  Other Topics Concern   Not on file  Social History Narrative   Not on file   Social Determinants of Health   Financial Resource Strain: Not on file  Food Insecurity: Not on file  Transportation Needs: Not on file  Physical Activity: Not on file  Stress: Not on file  Social Connections: Not on file     Family History: The patient's family history includes Cancer in his brother, father, maternal aunt, and mother; Deep vein thrombosis in his brother; Diabetes in his daughter, father, and mother; Heart disease in his daughter, mother, and another family member; Hyperlipidemia in his brother, daughter, and mother; Hypertension in his brother, daughter, father, and mother; Peripheral vascular disease in his daughter.  ROS:   Please see the history of present illness.    No specific complaints.  All other systems reviewed and are negative.  EKGs/Labs/Other Studies Reviewed:    The following studies were reviewed today: Recent laboratory data from June 08, 2019 through at Dr. Deforest Hoyles: Creatinine 1.47, BUN 32 Potassium 4.3 A1c 5.8  Doppler Echocardiogram September 2022: IMPRESSIONS   1. Left ventricular ejection fraction,  by estimation, is 40 to 45%. The  left ventricle has mildly decreased function. The left ventricle  demonstrates regional wall motion abnormalities (see scoring  diagram/findings for description). There is mild  concentric left ventricular hypertrophy. Left ventricular diastolic  function could not be evaluated. There is severe hypokinesis of the left  ventricular, apical septal wall and inferior wall.   2. Right ventricular systolic function is moderately reduced. The right  ventricular size is moderately enlarged. There is moderately elevated  pulmonary artery systolic pressure. The estimated right ventricular  systolic pressure is 70.6 mmHg.  3. Left atrial size was severely dilated.   4. Right atrial size was moderately dilated.   5. The mitral valve is degenerative. Mild mitral valve regurgitation.  Moderate to severe mitral annular calcification.   6. Tricuspid valve regurgitation is moderate.   7. The aortic valve is tricuspid. Aortic valve regurgitation is not  visualized. Mild to moderate aortic valve sclerosis/calcification is  present, without any evidence of aortic stenosis.   8. There is borderline dilatation of the ascending aorta, measuring 39  mm.   9. The inferior vena cava is dilated in size with >50% respiratory  variability, suggesting right atrial pressure of 8 mmHg.   Comparison(s): No significant change from prior study. Prior images  reviewed side by side. The left ventricular function is unchanged. The  left ventricular wall motion abnormality is unchanged.  RV systolic pressure is higher.   EKG:  EKG atrial fibrillation, controlled rate of 57 bpm, occasional PVC, atypical left bundle branch block, left axis deviation.  Since the most recent prior tracing in September 2022, the heart rate has increased.  Recent Labs: No results found for requested labs within last 365 days.  Recent Lipid Panel No results found for: "CHOL", "TRIG", "HDL", "CHOLHDL", "VLDL",  "LDLCALC", "LDLDIRECT"  Physical Exam:    VS:  BP 100/62   Pulse (!) 57   Ht _0  (1.778 m)   Wt 227 lb 3.2 oz (103.1 kg)   SpO2 99%   BMI 32.60 kg/m     Wt Readings from Last 3 Encounters:  06/14/22 227 lb 3.2 oz (103.1 kg)  09/14/21 229 lb 4 oz (104 kg)  06/11/21 218 lb 12.8 oz (99.2 kg)     GEN: Overweight no. No acute distress HEENT: Normal NECK: No JVD. LYMPHATICS: No lymphadenopathy CARDIAC: No murmur. RRR no gallop, or edema. VASCULAR:  Normal Pulses. No bruits. RESPIRATORY:  Clear to auscultation without rales, wheezing or rhonchi  ABDOMEN: Soft, non-tender, non-distended, No pulsatile mass, MUSCULOSKELETAL: No deformity  SKIN: Warm and dry NEUROLOGIC:  Alert and oriented x 3 PSYCHIATRIC:  Normal affect   ASSESSMENT:    1. Chronic combined systolic and diastolic heart failure ([place])   2. 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 Coronary artery disease involving coronary bypass graft of native heart without angina pectoris   4. Paroxysmal atrial fibrillation (HCC)   5. PAD (peripheral artery disease) ([place])   6. Essential hypertension   7. Chronic anticoagulation    PLAN:    In order of problems listed above:  Since addition of Jardiance, we have been able to decrease intensity of furosemide.  He is now taking 40 mg daily rather than 80 mg daily.  Most recent creatinine was 1.47 which is still slightly elevated compared to historical which had generally been in the 1.35 or less range.  Today we have decided to decrease furosemide to an effective dose of 20 mg/day.  He is scheduled to have laboratory data in 1 month with Dr. Deforest Hoyles.  Creatinine should improve.  In the meantime, he will monitor his weights and breathing.  If he develops increasing weight or shortness of breath he needs to call us immediately. He is on protective therapy.  I think he is slightly volume low.  Decrease furosemide to 20 mg daily. Asymptomatic relative to angina.   Secondary prevention reviewed. No recurrence No claudication Blood pressure is relatively low.  Decrease furosemide to 20 mg daily Continue warfarin rather than NOAC  due to cost.  4 to 61-monthfollow-up with new general cardiologist.   Guideline directed therapy for left ventricular systolic dysfunction: Angiotensin receptor-neprilysin inhibitor (ARNI)-Entresto; beta-blocker therapy - carvedilol, metoprolol succinate, or bisoprolol; mineralocorticoid receptor antagonist (MRA) therapy -spironolactone or eplerenone.  SGLT-2 agents -  Dapagliflozin ([place] or Empagliflozin (Jardiance).These therapies have been shown to improve clinical outcomes including reduction of rehospitalization, survival, and acute heart failure.   Medication Adjustments/Labs and Tests Ordered: Current medicines are reviewed at length with the patient today.  Concerns regarding medicines are outlined above.  No orders of the defined types were placed in this encounter.  No orders of the defined types were placed in this encounter.   Patient Instructions  Medication Instructions:  Your physician has recommended you make the following change in your medication:   1) DECREASE furosemide (Lasix) to 410mon Mondays, Wednesdays, and Fridays *If you need a refill on your cardiac medications before your next appointment, please call your pharmacy*  Lab Work: NONE  Testing/Procedures: NONE  Follow-Up: At [facility] and your health needs are our priority.  As part of our continuing mission to provide you with exceptional heart care, we have created designated Provider Care Teams.  These Care Teams include your primary Cardiologist (physician) and Advanced Practice Providers (APPs -  Physician Assistants and Nurse Practitioners) who all work together to provide you with the care you need, when you need it.  Your next appointment:   4-6 month(s)  The format for your next appointment:   In  Person  Provider:   HeSinclair GroomsMD   Important Information About Sugar         Signed, HeSinclair GroomsMD  06/14/2022 10:16 AM    [place]

## 2022-06-13 ENCOUNTER — Other Ambulatory Visit: Payer: Self-pay | Admitting: Interventional Cardiology

## 2022-06-14 ENCOUNTER — Ambulatory Visit: Payer: Medicare Other | Attending: Interventional Cardiology | Admitting: Interventional Cardiology

## 2022-06-14 ENCOUNTER — Encounter: Payer: Self-pay | Admitting: Interventional Cardiology

## 2022-06-14 VITALS — BP 100/62 | HR 57 | Ht 70.0 in | Wt 227.2 lb

## 2022-06-14 DIAGNOSIS — I5042 Chronic combined systolic (congestive) and diastolic (congestive) heart failure: Secondary | ICD-10-CM

## 2022-06-14 DIAGNOSIS — E1122 Type 2 diabetes mellitus with diabetic chronic kidney disease: Secondary | ICD-10-CM | POA: Diagnosis not present

## 2022-06-14 DIAGNOSIS — I739 Peripheral vascular disease, unspecified: Secondary | ICD-10-CM

## 2022-06-14 DIAGNOSIS — I129 Hypertensive chronic kidney disease with stage 1 through stage 4 chronic kidney disease, or unspecified chronic kidney disease: Secondary | ICD-10-CM

## 2022-06-14 DIAGNOSIS — N183 Chronic kidney disease, stage 3 unspecified: Secondary | ICD-10-CM

## 2022-06-14 DIAGNOSIS — I25810 Atherosclerosis of coronary artery bypass graft(s) without angina pectoris: Secondary | ICD-10-CM

## 2022-06-14 DIAGNOSIS — Z7901 Long term (current) use of anticoagulants: Secondary | ICD-10-CM

## 2022-06-14 DIAGNOSIS — I10 Essential (primary) hypertension: Secondary | ICD-10-CM

## 2022-06-14 DIAGNOSIS — I480 Paroxysmal atrial fibrillation: Secondary | ICD-10-CM | POA: Diagnosis not present

## 2022-06-14 MED ORDER — FUROSEMIDE 40 MG PO TABS: 40.0000 mg | ORAL_TABLET | ORAL | 3 refills | Status: AC

## 2022-06-14 NOTE — Patient Instructions (Signed)
Medication Instructions:  Your physician has recommended you make the following change in your medication:   1) DECREASE furosemide (Lasix) to '40mg'$  on Mondays, Wednesdays, and Fridays *If you need a refill on your cardiac medications before your next appointment, please call your pharmacy*  Lab Work: NONE  Testing/Procedures: NONE  Follow-Up: At [facility], you and your health needs are our priority.  As part of our continuing mission to provide you with exceptional heart care, we have created designated Provider Care Teams.  These Care Teams include your primary Cardiologist (physician) and Advanced Practice Providers (APPs -  Physician Assistants and Nurse Practitioners) who all work together to provide you with the care you need, when you need it.  Your next appointment:   4-6 month(s)  The format for your next appointment:   In Person  Provider:   Sinclair Grooms, MD   Important Information About Sugar

## 2022-08-19 ENCOUNTER — Other Ambulatory Visit: Payer: Self-pay | Admitting: Vascular Surgery

## 2022-08-19 DIAGNOSIS — I70219 Atherosclerosis of native arteries of extremities with intermittent claudication, unspecified extremity: Secondary | ICD-10-CM

## 2022-09-12 ENCOUNTER — Other Ambulatory Visit: Payer: Self-pay | Admitting: Interventional Cardiology

## 2023-05-22 ENCOUNTER — Other Ambulatory Visit (HOSPITAL_BASED_OUTPATIENT_CLINIC_OR_DEPARTMENT_OTHER): Payer: Self-pay

## 2023-05-22 MED ORDER — COVID-19 MRNA VAC-TRIS(PFIZER) 30 MCG/0.3ML IM SUSY
0.3000 mL | PREFILLED_SYRINGE | Freq: Once | INTRAMUSCULAR | 0 refills | Status: AC
  Filled 2023-05-22: qty 0.3, 1d supply, fill #0

## 2023-05-22 MED ORDER — INFLUENZA VAC A&B SURF ANT ADJ 0.5 ML IM SUSY
0.5000 mL | PREFILLED_SYRINGE | Freq: Once | INTRAMUSCULAR | 0 refills | Status: AC
  Filled 2023-05-22: qty 0.5, 1d supply, fill #0

## 2023-06-12 ENCOUNTER — Other Ambulatory Visit: Payer: Self-pay | Admitting: Internal Medicine

## 2023-06-12 DIAGNOSIS — E041 Nontoxic single thyroid nodule: Secondary | ICD-10-CM

## 2023-08-06 ENCOUNTER — Other Ambulatory Visit (HOSPITAL_COMMUNITY)
Admission: RE | Admit: 2023-08-06 | Discharge: 2023-08-06 | Disposition: A | Discharge status: Home / Self Care | Payer: Medicare Other | Source: Ambulatory Visit | Attending: Internal Medicine | Admitting: Internal Medicine

## 2023-08-06 ENCOUNTER — Ambulatory Visit
Admission: RE | Admit: 2023-08-06 | Discharge: 2023-08-06 | Disposition: A | Discharge status: Home / Self Care | Payer: Medicare Other | Source: Ambulatory Visit | Attending: Internal Medicine | Admitting: Internal Medicine

## 2023-08-06 DIAGNOSIS — E041 Nontoxic single thyroid nodule: Secondary | ICD-10-CM

## 2023-08-07 LAB — CYTOLOGY - NON PAP

## 2023-09-19 DIAGNOSIS — Z7901 Long term (current) use of anticoagulants: Secondary | ICD-10-CM | POA: Diagnosis not present

## 2023-10-01 DIAGNOSIS — H43823 Vitreomacular adhesion, bilateral: Secondary | ICD-10-CM | POA: Diagnosis not present

## 2023-10-01 DIAGNOSIS — E113293 Type 2 diabetes mellitus with mild nonproliferative diabetic retinopathy without macular edema, bilateral: Secondary | ICD-10-CM | POA: Diagnosis not present

## 2023-10-17 DIAGNOSIS — Z7901 Long term (current) use of anticoagulants: Secondary | ICD-10-CM | POA: Diagnosis not present

## 2023-11-14 DIAGNOSIS — Z7901 Long term (current) use of anticoagulants: Secondary | ICD-10-CM | POA: Diagnosis not present

## 2023-11-26 DIAGNOSIS — E1151 Type 2 diabetes mellitus with diabetic peripheral angiopathy without gangrene: Secondary | ICD-10-CM | POA: Diagnosis not present

## 2023-11-26 DIAGNOSIS — E785 Hyperlipidemia, unspecified: Secondary | ICD-10-CM | POA: Diagnosis not present

## 2023-11-26 DIAGNOSIS — I4821 Permanent atrial fibrillation: Secondary | ICD-10-CM | POA: Diagnosis not present

## 2023-11-26 DIAGNOSIS — I5022 Chronic systolic (congestive) heart failure: Secondary | ICD-10-CM | POA: Diagnosis not present

## 2023-11-26 DIAGNOSIS — E041 Nontoxic single thyroid nodule: Secondary | ICD-10-CM | POA: Diagnosis not present

## 2023-11-26 DIAGNOSIS — E113293 Type 2 diabetes mellitus with mild nonproliferative diabetic retinopathy without macular edema, bilateral: Secondary | ICD-10-CM | POA: Diagnosis not present

## 2023-11-26 DIAGNOSIS — I70213 Atherosclerosis of native arteries of extremities with intermittent claudication, bilateral legs: Secondary | ICD-10-CM | POA: Diagnosis not present

## 2023-11-26 DIAGNOSIS — D696 Thrombocytopenia, unspecified: Secondary | ICD-10-CM | POA: Diagnosis not present

## 2023-11-26 DIAGNOSIS — E039 Hypothyroidism, unspecified: Secondary | ICD-10-CM | POA: Diagnosis not present

## 2023-11-26 DIAGNOSIS — N182 Chronic kidney disease, stage 2 (mild): Secondary | ICD-10-CM | POA: Diagnosis not present

## 2023-11-26 DIAGNOSIS — I739 Peripheral vascular disease, unspecified: Secondary | ICD-10-CM | POA: Diagnosis not present

## 2023-11-26 DIAGNOSIS — I10 Essential (primary) hypertension: Secondary | ICD-10-CM | POA: Diagnosis not present

## 2023-12-10 DIAGNOSIS — Z1212 Encounter for screening for malignant neoplasm of rectum: Secondary | ICD-10-CM | POA: Diagnosis not present

## 2023-12-10 DIAGNOSIS — Z1211 Encounter for screening for malignant neoplasm of colon: Secondary | ICD-10-CM | POA: Diagnosis not present

## 2023-12-12 DIAGNOSIS — Z7901 Long term (current) use of anticoagulants: Secondary | ICD-10-CM | POA: Diagnosis not present

## 2024-01-12 DIAGNOSIS — Z7901 Long term (current) use of anticoagulants: Secondary | ICD-10-CM | POA: Diagnosis not present

## 2024-01-23 DIAGNOSIS — H0012 Chalazion right lower eyelid: Secondary | ICD-10-CM | POA: Diagnosis not present

## 2024-01-23 DIAGNOSIS — H0011 Chalazion right upper eyelid: Secondary | ICD-10-CM | POA: Diagnosis not present

## 2024-02-11 DIAGNOSIS — Z7901 Long term (current) use of anticoagulants: Secondary | ICD-10-CM | POA: Diagnosis not present

## 2024-03-16 DIAGNOSIS — Z7901 Long term (current) use of anticoagulants: Secondary | ICD-10-CM | POA: Diagnosis not present

## 2024-03-30 DIAGNOSIS — Z7901 Long term (current) use of anticoagulants: Secondary | ICD-10-CM | POA: Diagnosis not present

## 2024-04-29 DIAGNOSIS — Z7901 Long term (current) use of anticoagulants: Secondary | ICD-10-CM | POA: Diagnosis not present

## 2024-05-28 DIAGNOSIS — Z7901 Long term (current) use of anticoagulants: Secondary | ICD-10-CM | POA: Diagnosis not present

## 2024-06-08 DIAGNOSIS — Z23 Encounter for immunization: Secondary | ICD-10-CM | POA: Diagnosis not present

## 2024-06-08 DIAGNOSIS — E113293 Type 2 diabetes mellitus with mild nonproliferative diabetic retinopathy without macular edema, bilateral: Secondary | ICD-10-CM | POA: Diagnosis not present

## 2024-06-08 DIAGNOSIS — I25119 Atherosclerotic heart disease of native coronary artery with unspecified angina pectoris: Secondary | ICD-10-CM | POA: Diagnosis not present

## 2024-06-08 DIAGNOSIS — E1151 Type 2 diabetes mellitus with diabetic peripheral angiopathy without gangrene: Secondary | ICD-10-CM | POA: Diagnosis not present

## 2024-06-08 DIAGNOSIS — Z0000 Encounter for general adult medical examination without abnormal findings: Secondary | ICD-10-CM | POA: Diagnosis not present

## 2024-06-08 DIAGNOSIS — I5022 Chronic systolic (congestive) heart failure: Secondary | ICD-10-CM | POA: Diagnosis not present

## 2024-06-08 DIAGNOSIS — E039 Hypothyroidism, unspecified: Secondary | ICD-10-CM | POA: Diagnosis not present

## 2024-06-08 DIAGNOSIS — I4821 Permanent atrial fibrillation: Secondary | ICD-10-CM | POA: Diagnosis not present

## 2024-06-08 DIAGNOSIS — I10 Essential (primary) hypertension: Secondary | ICD-10-CM | POA: Diagnosis not present

## 2024-06-08 DIAGNOSIS — E041 Nontoxic single thyroid nodule: Secondary | ICD-10-CM | POA: Diagnosis not present

## 2024-06-08 DIAGNOSIS — E785 Hyperlipidemia, unspecified: Secondary | ICD-10-CM | POA: Diagnosis not present

## 2024-06-08 DIAGNOSIS — N182 Chronic kidney disease, stage 2 (mild): Secondary | ICD-10-CM | POA: Diagnosis not present

## 2024-06-25 DIAGNOSIS — Z7901 Long term (current) use of anticoagulants: Secondary | ICD-10-CM | POA: Diagnosis not present
